# Patient Record
Sex: Male | Born: 1985 | Race: White | Hispanic: No | Marital: Married | Smoking: Former smoker
Health system: Southern US, Community
[De-identification: ages and names within clinical notes are randomized; demographics above are authoritative.]

## PROBLEM LIST (undated history)

## (undated) DIAGNOSIS — J301 Allergic rhinitis due to pollen: Secondary | ICD-10-CM

## (undated) HISTORY — DX: Allergic rhinitis due to pollen: J30.1

## (undated) HISTORY — PX: WISDOM TOOTH EXTRACTION: SHX21

---

## 2019-08-19 ENCOUNTER — Other Ambulatory Visit: Payer: Self-pay

## 2019-08-19 ENCOUNTER — Encounter: Payer: Self-pay | Admitting: Internal Medicine

## 2019-08-19 ENCOUNTER — Ambulatory Visit (INDEPENDENT_AMBULATORY_CARE_PROVIDER_SITE_OTHER): Payer: 59 | Admitting: Internal Medicine

## 2019-08-19 VITALS — BP 118/86 | HR 70 | Temp 97.7°F | Ht 70.25 in | Wt 222.0 lb

## 2019-08-19 DIAGNOSIS — Z0000 Encounter for general adult medical examination without abnormal findings: Secondary | ICD-10-CM | POA: Diagnosis not present

## 2019-08-19 DIAGNOSIS — J301 Allergic rhinitis due to pollen: Secondary | ICD-10-CM | POA: Insufficient documentation

## 2019-08-19 DIAGNOSIS — Z23 Encounter for immunization: Secondary | ICD-10-CM | POA: Diagnosis not present

## 2019-08-19 NOTE — Addendum Note (Signed)
Addended by: Pilar Grammes on: 08/19/2019 12:29 PM   Modules accepted: Orders

## 2019-08-19 NOTE — Assessment & Plan Note (Signed)
Healthy Needs to get back to his healthy lifestyle and get his weight back down Will defer blood work Tdap today Annual flu vaccine

## 2019-08-19 NOTE — Assessment & Plan Note (Signed)
Discussed stopping the afrin

## 2019-08-19 NOTE — Progress Notes (Signed)
Subjective:    Patient ID: Jamie Baldwin, male    DOB: [birth year], 33 y.o.   MRN: [id number]  HPI Here to establish care and physical  This visit occurred during the SARS-CoV-2 public health emergency.  Safety protocols were in place, including screening questions prior to the visit, additional usage of staff PPE, and extensive cleaning of exam room while observing appropriate contact time as indicated for disinfecting solutions.   No concerns  No current outpatient medications on file prior to visit.   No current facility-administered medications on file prior to visit.     No Known Allergies  History reviewed. No pertinent past medical history.  Past Surgical History:  Procedure Laterality Date  . WISDOM TOOTH EXTRACTION      Family History  Problem Relation Age of Onset  . Diabetes Neg Hx   . Cancer Neg Hx   . Heart disease Neg Hx     Social History   Socioeconomic History  . Marital status: Married    Spouse name: Not on file  . Number of children: 0  . Years of education: Not on file  . Highest education level: Not on file  Occupational History  . 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 Financial resource strain: Not on file  . Food insecurity    Worry: Not on file    Inability: Not on file  . Transportation needs    Medical: Not on file    Non-medical: Not on file  Tobacco Use  . Smoking status: Former Smoker    Quit date: 2019    Years since quitting: 1.9  . Smokeless tobacco: Former Systems developer    Quit date: 2008  Substance and Sexual Activity  . Alcohol use: Not on file    Comment: very occasional  . Drug use: Not on file  . Sexual activity: Not on file  Lifestyle  . Physical activity    Days per week: Not on file    Minutes per session: Not on file  . Stress: Not on file  Relationships  . Social Herbalist on phone: Not on file    Gets together: Not on file    Attends religious service: Not on file    Active member of club or  organization: Not on file    Attends meetings of clubs or organizations: Not on file    Relationship status: Not on file  . Intimate partner violence    Fear of current or ex partner: Not on file    Emotionally abused: Not on file    Physically abused: Not on file    Forced sexual activity: Not on file  Other Topics Concern  . Not on file  Social History Narrative  . Not on file   Review of Systems  Constitutional:       Weight up and down--depending on fitness efforts (up a lot since COVID) Wears seat belt  HENT: Negative for dental problem, hearing loss and tinnitus.        Keeps up with dentist  Eyes: Negative for visual disturbance.       No diplopia or unilateral vision loss  Respiratory: Negative for cough, chest tightness and shortness of breath.   Cardiovascular: Negative for chest pain and leg swelling.       Some palpitations if excessive caffeine  Gastrointestinal: Negative for abdominal pain, blood in stool and constipation.       No heartburn  Endocrine: Negative for polydipsia  and polyuria.  Genitourinary: Negative for difficulty urinating and urgency.       No sexual problems  Musculoskeletal: Negative for arthralgias, back pain and joint swelling.  Skin: Negative for rash.       No suspicious skin lesions  Allergic/Immunologic: Positive for environmental allergies. Negative for immunocompromised state.       Spring allergies have persisted----using afrin daily Discussed stopping this  Neurological: Negative for dizziness, syncope, light-headedness and headaches.  Hematological: Negative for adenopathy. Does not bruise/bleed easily.  Psychiatric/Behavioral: Positive for sleep disturbance. Negative for dysphoric mood. The patient is not nervous/anxious.        Awakens every 3-4 hours. No snoring or apnea       Objective:   Physical Exam  Constitutional: He is oriented to person, place, and time. He appears well-developed. No distress.  HENT:  Head:  Normocephalic and atraumatic.  Right Ear: External ear normal.  Left Ear: External ear normal.  Mouth/Throat: Oropharynx is clear and moist. No oropharyngeal exudate.  Eyes: Pupils are equal, round, and reactive to light. Conjunctivae are normal.  Neck: No thyromegaly present.  Cardiovascular: Normal rate, regular rhythm, normal heart sounds and intact distal pulses. Exam reveals no gallop.  No murmur heard. Respiratory: Effort normal and breath sounds normal. No respiratory distress. He has no wheezes. He has no rales.  GI: Soft. There is no abdominal tenderness.  Musculoskeletal:        General: No tenderness or edema.  Lymphadenopathy:    He has no cervical adenopathy.  Neurological: He is alert and oriented to person, place, and time.  Skin: No rash noted. No erythema.  Psychiatric: He has a normal mood and affect. His behavior is normal.           Assessment & Plan:

## 2019-09-11 DIAGNOSIS — M48061 Spinal stenosis, lumbar region without neurogenic claudication: Secondary | ICD-10-CM

## 2019-09-11 HISTORY — DX: Spinal stenosis, lumbar region without neurogenic claudication: M48.061

## 2020-05-06 ENCOUNTER — Telehealth (HOSPITAL_COMMUNITY): Payer: Self-pay | Admitting: Radiology

## 2020-05-06 NOTE — Telephone Encounter (Signed)
Pt was referred to [facility] for L3 compression fracture. Called the referring office, left VM that pt only has x-rays and per Dr. Corliss Skains will need an MRI to determine if he is a candidate for treatment. JM

## 2020-05-17 ENCOUNTER — Telehealth (HOSPITAL_COMMUNITY): Payer: Self-pay

## 2020-05-17 NOTE — Telephone Encounter (Signed)
Referring office sent over a cd with imaging but the imaging on the CD was of xray films. Called the office back to inform them that the pt will need an MRI before we could get them in. Left a vm. AW

## 2020-05-18 ENCOUNTER — Other Ambulatory Visit: Payer: Self-pay | Admitting: Internal Medicine

## 2020-05-18 DIAGNOSIS — M545 Low back pain, unspecified: Secondary | ICD-10-CM

## 2020-05-18 DIAGNOSIS — S32030S Wedge compression fracture of third lumbar vertebra, sequela: Secondary | ICD-10-CM

## 2020-05-25 ENCOUNTER — Ambulatory Visit (HOSPITAL_COMMUNITY): Admission: RE | Admit: 2020-05-25 | Payer: 59 | Source: Ambulatory Visit

## 2020-05-31 ENCOUNTER — Other Ambulatory Visit: Payer: Self-pay

## 2020-05-31 ENCOUNTER — Ambulatory Visit (HOSPITAL_COMMUNITY)
Admission: RE | Admit: 2020-05-31 | Discharge: 2020-05-31 | Disposition: A | Discharge status: Home / Self Care | Payer: 59 | Source: Ambulatory Visit | Attending: Internal Medicine | Admitting: Internal Medicine

## 2020-05-31 DIAGNOSIS — M545 Low back pain, unspecified: Secondary | ICD-10-CM

## 2020-05-31 DIAGNOSIS — S32030S Wedge compression fracture of third lumbar vertebra, sequela: Secondary | ICD-10-CM | POA: Diagnosis present

## 2020-05-31 MED ORDER — GADOBUTROL 1 MMOL/ML IV SOLN
10.0000 mL | Freq: Once | INTRAVENOUS | Status: AC | PRN
  Administered 2020-05-31: 10 mL via INTRAVENOUS

## 2020-06-01 ENCOUNTER — Telehealth (HOSPITAL_COMMUNITY): Payer: Self-pay

## 2020-06-01 NOTE — Telephone Encounter (Signed)
Called Dr. Elisha Headland office to inform them that Dr. Corliss Skains reviewed pt's most recent mri lumber for possible compression fracture. Per Dr. Corliss Skains, scan does not show any fractures and KP is not needed. Receptionist will let Dr. Kerry Dory know. AW

## 2020-06-16 ENCOUNTER — Ambulatory Visit: Payer: 59 | Admitting: Specialist

## 2020-06-16 ENCOUNTER — Ambulatory Visit: Payer: Self-pay

## 2020-06-16 ENCOUNTER — Encounter: Payer: Self-pay | Admitting: Specialist

## 2020-06-16 ENCOUNTER — Other Ambulatory Visit: Payer: Self-pay

## 2020-06-16 VITALS — BP 147/89 | HR 79 | Ht 72.5 in | Wt 188.8 lb

## 2020-06-16 DIAGNOSIS — M4726 Other spondylosis with radiculopathy, lumbar region: Secondary | ICD-10-CM

## 2020-06-16 DIAGNOSIS — M5416 Radiculopathy, lumbar region: Secondary | ICD-10-CM

## 2020-06-16 DIAGNOSIS — M5137 Other intervertebral disc degeneration, lumbosacral region: Secondary | ICD-10-CM

## 2020-06-16 DIAGNOSIS — M545 Low back pain, unspecified: Secondary | ICD-10-CM | POA: Diagnosis not present

## 2020-06-16 MED ORDER — TRAMADOL HCL 50 MG PO TABS: 100.0000 mg | ORAL_TABLET | Freq: Four times a day (QID) | ORAL | 0 refills | Status: DC | PRN

## 2020-06-16 MED ORDER — GABAPENTIN 300 MG PO CAPS: ORAL_CAPSULE | ORAL | 0 refills | Status: DC

## 2020-06-16 MED ORDER — METHYLPREDNISOLONE 4 MG PO TBPK: ORAL_TABLET | ORAL | 0 refills | Status: DC

## 2020-06-16 NOTE — Progress Notes (Signed)
Office Visit Note   Patient: Jamie Baldwin           Date of Birth: Dec 30, 1985           MRN: [id number] Visit Date: 06/16/2020              Requested by: Karie Schwalbe, MD [address] [place],  [place] [address] PCP: Angela Cox, MD   Assessment & Plan: Visit Diagnoses:  1. Low back pain, unspecified back pain laterality, unspecified chronicity, unspecified whether sciatica present   2. Lumbar radiculopathy   3. Disc disease, degenerative, lumbar or lumbosacral   4. Other spondylosis with radiculopathy, lumbar region     Plan: Avoid bending, stooping and avoid lifting weights greater than 10 lbs. Avoid prolong standing and walking. Avoid frequent bending and stooping  No lifting greater than 10 lbs. May use ice or moist heat for pain. Weight loss is of benefit. Handicap license is approved. Dr. [place] Blas secretary/Assistant will call to arrange for epidural steroid injection   Follow-Up Instructions: No follow-ups on file.   Orders:  Orders Placed This Encounter  Procedures  . XR Lumbar Spine 2-3 Views   No orders of the defined types were placed in this encounter.     Procedures: No procedures performed   Clinical Data: No additional findings.   Subjective: Chief Complaint  Patient presents with  . Lower Back - Pain    34 year old male fell on slippery back steps of house taking the dogs out and fell backwards landing on the back. Had pain in the back for about 2 weeks and saw a DC twice, short leg and pelvis bone was ?Marland Kitchen Felt better after 2 visits and stopped. Returned to normal hiked, went to the gym, played softball and lost 50 lbs this past summer 221 down to 173 then at the end of August he was sitting in chair at work when He went to stand and pain in the back and goes into the right mid thigh area not below the knee, posteriorly. No numbness, burning quality of  Pain that is sharp, feels like steakknives in the back. There is pain with  cough and sneezing. No bowel or bladder difficulty other than valsalva. He has no tingling or numbness. At times there is a sensation of weakness and he tires easly with walking . 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 There is pain with bending,stooping and riding in car is painful. Pain with twisting and turning.   Review of Systems  Constitutional: Positive for activity change and unexpected weight change. Negative for appetite change, chills, diaphoresis, fatigue and fever.  HENT: Negative.  Negative for congestion, dental problem, drooling, ear discharge, ear pain, facial swelling, hearing loss, mouth sores, nosebleeds, postnasal drip, rhinorrhea, sinus pressure, sinus pain, sneezing, sore throat, tinnitus, trouble swallowing and voice change.   Eyes: Negative for photophobia, pain, discharge, redness, itching and visual disturbance.  Respiratory: Negative.  Negative for apnea, cough, choking, chest tightness, shortness of breath, wheezing and stridor.   Cardiovascular: Negative.  Negative for chest pain, palpitations and leg swelling.  Gastrointestinal: Negative.  Negative for abdominal distention, abdominal pain, anal bleeding, blood in stool, constipation, diarrhea, nausea, rectal pain and vomiting.  Endocrine: Negative.  Negative for cold intolerance, heat intolerance, polydipsia and polyuria.  Genitourinary: Negative.  Negative for difficulty urinating, dysuria, enuresis, flank pain, frequency, genital sores and urgency.  Musculoskeletal: Positive for back pain and gait problem. Negative for arthralgias,  joint swelling, myalgias, neck pain and neck stiffness.  Skin: Negative.  Negative for color change, pallor, rash and wound.  Allergic/Immunologic: Negative for environmental allergies, food allergies and immunocompromised state.  Neurological: Positive for weakness. Negative for dizziness, tremors, seizures, syncope, facial asymmetry, speech difficulty, light-headedness, numbness  and headaches.  Hematological: Negative.  Negative for adenopathy. Does not bruise/bleed easily.  Psychiatric/Behavioral: Negative for agitation, behavioral problems, confusion, decreased concentration, dysphoric mood, hallucinations, self-injury, sleep disturbance and suicidal ideas. The patient is not nervous/anxious and is not hyperactive.      Objective: Vital Signs: BP (!) 147/89 (BP Location: Left Arm, Patient Position: Sitting)   Pulse 79   Ht 6' 0.5" (1.842 m)   Wt 188 lb 12.8 oz (85.6 kg)   BMI 25.25 kg/m   Physical Exam Constitutional:      Appearance: He is well-developed.  HENT:     Head: Normocephalic and atraumatic.  Eyes:     Pupils: Pupils are equal, round, and reactive to light.  Pulmonary:     Effort: Pulmonary effort is normal.     Breath sounds: Normal breath sounds.  Abdominal:     General: Bowel sounds are normal.     Palpations: Abdomen is soft.  Musculoskeletal:     Cervical back: Normal range of motion and neck supple.     Lumbar back: Positive right straight leg raise test. Negative left straight leg raise test.  Skin:    General: Skin is warm and dry.  Neurological:     Mental Status: He is alert and oriented to person, place, and time.  Psychiatric:        Behavior: Behavior normal.        Thought Content: Thought content normal.        Judgment: Judgment normal.     Back Exam   Tenderness  The patient is experiencing tenderness in the lumbar.  Range of Motion  Extension: abnormal  Flexion: abnormal  Lateral bend right: abnormal  Lateral bend left: abnormal  Rotation right: abnormal  Rotation left: abnormal   Muscle Strength  Right Quadriceps:  5/5  Left Quadriceps:  5/5  Right Hamstrings:  5/5  Left Hamstrings:  5/5   Tests  Straight leg raise right: positive Straight leg raise left: negative  Reflexes  Patellar: 3/4 Achilles: 2/4 Babinski's sign: normal   Comments:  Opposite SLR sign with left buttock pain with raising  right leg.       Specialty Comments:  No specialty comments available.  Imaging: No results found.   PMFS History: Patient Active Problem List   Diagnosis Date Noted  . Preventative health care 08/19/2019  . Allergic rhinitis due to pollen    Past Medical History:  Diagnosis Date  . Allergic rhinitis due to pollen     Family History  Problem Relation Age of Onset  . Diabetes Neg Hx   . Cancer Neg Hx   . Heart disease Neg Hx     Past Surgical History:  Procedure Laterality Date  . WISDOM TOOTH EXTRACTION     Social History   Occupational History  . 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 Smoking status: Former Smoker    Quit date: 2019    Years since quitting: 2.7  . Smokeless tobacco: Former Neurosurgeon    Quit date: 2008  Substance and Sexual Activity  . Alcohol use: Not on file    Comment: very occasional  . Drug use: Not on file  . Sexual activity:  Not on file

## 2020-06-16 NOTE — Patient Instructions (Signed)
Avoid bending, stooping and avoid lifting weights greater than 10 lbs. Avoid prolong standing and walking. Avoid frequent bending and stooping  No lifting greater than 10 lbs. May use ice or moist heat for pain. Weight loss is of benefit. Handicap license is approved. Dr. [place] Blas secretary/Assistant will call to arrange for epidural steroid injection  Gabapentin ramp one tablet per day up to TID for nerve pain Medrol dose pak steroids to decrease nerve swelling. Recliner or bed wedge.  Tramadol for pain.

## 2020-06-30 ENCOUNTER — Ambulatory Visit: Payer: 59 | Admitting: Surgery

## 2020-07-06 ENCOUNTER — Encounter: Payer: Self-pay | Admitting: Physical Medicine and Rehabilitation

## 2020-07-06 ENCOUNTER — Ambulatory Visit: Payer: 59 | Admitting: Physical Medicine and Rehabilitation

## 2020-07-06 ENCOUNTER — Other Ambulatory Visit: Payer: Self-pay

## 2020-07-06 ENCOUNTER — Ambulatory Visit: Payer: Self-pay

## 2020-07-06 VITALS — BP 129/90 | HR 70

## 2020-07-06 DIAGNOSIS — M5416 Radiculopathy, lumbar region: Secondary | ICD-10-CM

## 2020-07-06 MED ORDER — METHYLPREDNISOLONE ACETATE 80 MG/ML IJ SUSP
80.0000 mg | Freq: Once | INTRAMUSCULAR | Status: AC
  Administered 2020-07-06: 15:00:00 80 mg

## 2020-07-06 NOTE — Progress Notes (Signed)
Pt state lower back pain that travels up to his mid back and also down to his buttocks. Pt state bending over, sitting and walking for a long period of time makes the pain worse. Pt state he takes pain meds at night and heating and icing to helps ease the pain.  Numeric Pain Rating Scale and Functional Assessment Average Pain 8   In the last MONTH (on 0-10 scale) has pain interfered with the following?  1. General activity like being  able to carry out your everyday physical activities such as walking, climbing stairs, carrying groceries, or moving a chair?  Rating(10)   +Driver, -BT, -Dye Allergies.

## 2020-08-23 NOTE — Progress Notes (Signed)
Jamie Baldwin - 34 y.o. male MRN [id number]  Date of birth: 1986/03/24  Office Visit Note: Visit Date: 07/06/2020 PCP: Angela Cox, MD Referred by: Angela Cox, MD  Subjective: Chief Complaint  Patient presents with  . Lower Back - Pain   HPI:  Jamie Baldwin is a 34 y.o. male who comes in today at the request of Dr. Vira Browns for planned Left L5-S1 Lumbar epidural steroid injection with fluoroscopic guidance.  The patient has failed conservative care including home exercise, medications, time and activity modification.  This injection will be diagnostic and hopefully therapeutic.  Please see requesting physician notes for further details and justification.   ROS Otherwise per HPI.  Assessment & Plan: Visit Diagnoses:    ICD-10-CM   1. Lumbar radiculopathy  M54.16 XR C-ARM NO REPORT    Epidural Steroid injection    methylPREDNISolone acetate (DEPO-MEDROL) injection 80 mg    Plan: No additional findings.   Meds & Orders:  Meds ordered this encounter  Medications  . methylPREDNISolone acetate (DEPO-MEDROL) injection 80 mg    Orders Placed This Encounter  Procedures  . XR C-ARM NO REPORT  . Epidural Steroid injection    Follow-up: Return for visit to requesting physician as needed.   Procedures: No procedures performed  Lumbosacral Transforaminal Epidural Steroid Injection - Sub-Pedicular Approach with Fluoroscopic Guidance  Patient: Jamie Baldwin      Date of Birth: [birth year] MRN: [id number] PCP: Angela Cox, MD      Visit Date: 07/06/2020   Universal Protocol:    Date/Time: 07/06/2020  Consent Given By: the patient  Position: PRONE  Additional Comments: Vital signs were monitored before and after the procedure. Patient was prepped and draped in the usual sterile fashion. The correct patient, procedure, and site was verified.   Injection Procedure Details:   Procedure diagnoses: Lumbar radiculopathy [M54.16]    Meds Administered:  Meds  ordered this encounter  Medications  . methylPREDNISolone acetate (DEPO-MEDROL) injection 80 mg    Laterality: Left  Location/Site:  L5-S1  Needle:5.0 in., 22 ga.  Short bevel or Quincke spinal needle  Needle Placement: Transforaminal  Findings:    -Comments: Excellent flow of contrast along the nerve, nerve root and into the epidural space.  Procedure Details: After squaring off the end-plates to get a true AP view, the C-arm was positioned so that an oblique view of the foramen as noted above was visualized. The target area is just inferior to the "nose of the scotty dog" or sub pedicular. The soft tissues overlying this structure were infiltrated with 2-3 ml. of 1% Lidocaine without Epinephrine.  The spinal needle was inserted toward the target using a "trajectory" view along the fluoroscope beam.  Under AP and lateral visualization, the needle was advanced so it did not puncture dura and was located close the 6 O'Clock position of the pedical in AP tracterory. Biplanar projections were used to confirm position. Aspiration was confirmed to be negative for CSF and/or blood. A 1-2 ml. volume of Isovue-250 was injected and flow of contrast was noted at each level. Radiographs were obtained for documentation purposes.   After attaining the desired flow of contrast documented above, a 0.5 to 1.0 ml test dose of 0.25% Marcaine was injected into each respective transforaminal space.  The patient was observed for 90 seconds post injection.  After no sensory deficits were reported, and normal lower extremity motor function was noted,   the above injectate was administered so that equal  amounts of the injectate were placed at each foramen (level) into the transforaminal epidural space.   Additional Comments:  The patient tolerated the procedure well Dressing: 2 x 2 sterile gauze and Band-Aid    Post-procedure details: Patient was observed during the procedure. Post-procedure instructions  were reviewed.  Patient left the clinic in stable condition.      Clinical History: MRI LUMBAR SPINE WITHOUT AND WITH CONTRAST  TECHNIQUE: Multiplanar and multiecho pulse sequences of the lumbar spine were obtained without and with intravenous contrast.  CONTRAST:  59mL GADAVIST GADOBUTROL 1 MMOL/ML IV SOLN  COMPARISON:  None.  FINDINGS: Segmentation:  Standard.  Alignment:  Straightening of lordosis.  Vertebrae: Modic type 2 endplate degenerative changes at the L4-S1 level. No focal osseous lesion. No abnormal enhancement.  Conus medullaris and cauda equina: Conus extends to the L1 level. Conus and cauda equina appear normal. No abnormal enhancement.  Disc levels: Multilevel desiccation. Mild L2-3 and moderate L5-S1 disc space loss.  L1-2: No significant disc bulge, spinal canal or neural foraminal narrowing.  L2-3: Mild disc bulge with superimposed right subarticular/foraminal protrusion abutting the descending right L3 nerve root. Bilateral facet degenerative spurring. Mild spinal canal narrowing. Patent neural foramen.  L3-4: Mild disc bulge and bilateral facet degenerative spurring. Patent spinal canal and neural foramen.  L4-5: Mild disc bulge with small central protrusion. Right extraforaminal annular fissuring. Bilateral facet degenerative spurring. Mild bilateral neural foraminal narrowing. Patent spinal canal.  L5-S1: Mild disc bulge with superimposed central protrusion/annular fissuring. There is abutment of the exiting left L5 and descending left S1 nerve roots. Bilateral facet degenerative spurring. Mild left neural foraminal narrowing. Patent spinal canal and right neural foramen.  Paraspinal and other soft tissues: Negative.  IMPRESSION: Right L2-3 subarticular/foraminal protrusion abutting the descending right L3 nerve root.  Central L5-S1 protrusion/annular fissuring abutting the exiting left L5 and descending left S1 nerve  roots.  Mild L2-3 spinal canal narrowing.  Mild bilateral L4-5 and left L5-S1 neural foraminal narrowing.   Electronically Signed   By: Stana Bunting M.D.   On: 05/31/2020 09:33     Objective:  VS:  HT:    WT:   BMI:     BP:129/90  HR:70bpm  TEMP: ( )  RESP:  Physical Exam Constitutional:      General: He is not in acute distress.    Appearance: Normal appearance. He is not ill-appearing.  HENT:     Head: Normocephalic and atraumatic.     Right Ear: External ear normal.     Left Ear: External ear normal.  Eyes:     Extraocular Movements: Extraocular movements intact.  Cardiovascular:     Rate and Rhythm: Normal rate.     Pulses: Normal pulses.  Abdominal:     General: There is no distension.     Palpations: Abdomen is soft.  Musculoskeletal:        General: No tenderness or signs of injury.     Right lower leg: No edema.     Left lower leg: No edema.     Comments: Patient has good distal strength without clonus.  Positive left slump.  Skin:    Findings: No erythema or rash.  Neurological:     General: No focal deficit present.     Mental Status: He is alert and oriented to person, place, and time.     Sensory: No sensory deficit.     Motor: No weakness or abnormal muscle tone.     Coordination:  Coordination normal.  Psychiatric:        Mood and Affect: Mood normal.        Behavior: Behavior normal.      Imaging: No results found.

## 2020-08-23 NOTE — Procedures (Signed)
Lumbosacral Transforaminal Epidural Steroid Injection - Sub-Pedicular Approach with Fluoroscopic Guidance  Patient: Jamie Baldwin      Date of Birth: [birth year] MRN: [id number] PCP: Angela Cox, MD      Visit Date: 07/06/2020   Universal Protocol:    Date/Time: 07/06/2020  Consent Given By: the patient  Position: PRONE  Additional Comments: Vital signs were monitored before and after the procedure. Patient was prepped and draped in the usual sterile fashion. The correct patient, procedure, and site was verified.   Injection Procedure Details:   Procedure diagnoses: Lumbar radiculopathy [M54.16]    Meds Administered:  Meds ordered this encounter  Medications   methylPREDNISolone acetate (DEPO-MEDROL) injection 80 mg    Laterality: Left  Location/Site:  L5-S1  Needle:5.0 in., 22 ga.  Short bevel or Quincke spinal needle  Needle Placement: Transforaminal  Findings:    -Comments: Excellent flow of contrast along the nerve, nerve root and into the epidural space.  Procedure Details: After squaring off the end-plates to get a true AP view, the C-arm was positioned so that an oblique view of the foramen as noted above was visualized. The target area is just inferior to the "nose of the scotty dog" or sub pedicular. The soft tissues overlying this structure were infiltrated with 2-3 ml. of 1% Lidocaine without Epinephrine.  The spinal needle was inserted toward the target using a "trajectory" view along the fluoroscope beam.  Under AP and lateral visualization, the needle was advanced so it did not puncture dura and was located close the 6 O'Clock position of the pedical in AP tracterory. Biplanar projections were used to confirm position. Aspiration was confirmed to be negative for CSF and/or blood. A 1-2 ml. volume of Isovue-250 was injected and flow of contrast was noted at each level. Radiographs were obtained for documentation purposes.   After attaining the desired  flow of contrast documented above, a 0.5 to 1.0 ml test dose of 0.25% Marcaine was injected into each respective transforaminal space.  The patient was observed for 90 seconds post injection.  After no sensory deficits were reported, and normal lower extremity motor function was noted,   the above injectate was administered so that equal amounts of the injectate were placed at each foramen (level) into the transforaminal epidural space.   Additional Comments:  The patient tolerated the procedure well Dressing: 2 x 2 sterile gauze and Band-Aid    Post-procedure details: Patient was observed during the procedure. Post-procedure instructions were reviewed.  Patient left the clinic in stable condition.

## 2020-09-15 ENCOUNTER — Encounter: Payer: Self-pay | Admitting: Specialist

## 2020-09-15 ENCOUNTER — Other Ambulatory Visit: Payer: Self-pay

## 2020-09-15 ENCOUNTER — Ambulatory Visit: Payer: 59 | Admitting: Specialist

## 2020-09-15 VITALS — BP 139/85 | HR 78 | Ht 72.25 in | Wt 188.8 lb

## 2020-09-15 DIAGNOSIS — M4726 Other spondylosis with radiculopathy, lumbar region: Secondary | ICD-10-CM

## 2020-09-15 DIAGNOSIS — M5137 Other intervertebral disc degeneration, lumbosacral region: Secondary | ICD-10-CM

## 2020-09-15 DIAGNOSIS — M5416 Radiculopathy, lumbar region: Secondary | ICD-10-CM

## 2020-09-15 NOTE — Progress Notes (Addendum)
Office Visit Note   Patient: Jamie Baldwin           Date of Birth: 06-08-1986           MRN: [id number] Visit Date: 09/15/2020              Requested by: Angela Cox, MD [address] [place],  [place] [address] PCP: Angela Cox, MD   Assessment & Plan: Visit Diagnoses:  1. Lumbar radiculopathy   2. Disc disease, degenerative, lumbar or lumbosacral   3. Other spondylosis with radiculopathy, lumbar region     Plan: Avoid bending, stooping and avoid lifting weights greater than 10 lbs. Avoid prolong standing and walking. Order for a new walker with wheels. Surgery scheduling secretary Tivis Ringer, will call you in the next week to schedule for surgery.  Surgery recommended is a two level lumbar fusion L5-S1 and L4-5  this would be done with rods, screws and cages with local bone graft and allograft (donor bone graft). Take hydrocodone for for pain. Risk of surgery includes risk of infection 1 in 200 patients, bleeding 1/2% chance you would need a transfusion.   Risk to the nerves is one in 10,000. You will need to use a brace for 3 months and wean from the brace on the 4th month. Expect improved walking and standing tolerance. Expect relief of leg pain but numbness may persist depending on the length and degree of pressure that has been present  Follow-Up Instructions: No follow-ups on file.   Orders:  No orders of the defined types were placed in this encounter.  No orders of the defined types were placed in this encounter.     Procedures: No procedures performed   Clinical Data: No additional findings.   Subjective: Chief Complaint  Patient presents with  . Lower Back - Follow-up    He had Left L5-S1 TF Injection with Dr. Alvester Morin on 07/06/20 and he states that he did not get any relief with the injection    35 year old male with persistent back and bilateral thigh and buttock pain. His job is primarily sitting and desk work. He is  having pain with sitting and riding car and with any bend or twist. An attempt at left transforaminal ESI was able to replicate the left leg pain with the instillation of the steroid and local but did not provide any long term or even short term relief. He is unable to walk without pain. He is not taking narcotics and performing work on a continuous basis. Has night pain and difficulty sleeping due to back pain and pain with roling in  Bed. Underwent MRI 06/2020 showing degenerative disc dessication L2-3, L3-4, L4-5 and L5-S1. There is a small non compressive disc  Bulge right L2-3, noncompressive bulge L3-4 without stenosis. At L4-5 and L5-S1 there are modic changes of the anterior disc end plates and left sided L5-S1 foraminal disc herniation which likely impresses on the left L5 nerve root. He has been treated conservatively for well over a year and is unable to function normally with persistent mechanical disc pain and discomfort associated with any flexion or twisting  Moment.    Review of Systems  Constitutional: Positive for activity change and unexpected weight change (weight gain due to inability to ambulate.). Negative for appetite change, chills, diaphoresis, fatigue and fever.  HENT: Negative.  Negative for congestion, dental problem, drooling, ear discharge, ear pain, facial swelling, hearing loss, mouth sores, nosebleeds, postnasal  drip, rhinorrhea, sinus pressure, sinus pain, sneezing, sore throat, tinnitus, trouble swallowing and voice change.   Eyes: Negative.   Respiratory: Negative.   Cardiovascular: Negative.   Gastrointestinal: Negative.   Genitourinary: Negative.   Musculoskeletal: Negative.   Skin: Negative.   Hematological: Negative.   Psychiatric/Behavioral: Negative.      Objective: Vital Signs: BP 139/85   Pulse 78   Ht 6' 0.25" (1.835 m)   Wt 188 lb 12.8 oz (85.6 kg)   BMI 25.43 kg/m   Physical Exam  Ortho Exam  Specialty Comments:  No specialty comments  available.  Imaging: No results found.   PMFS History: Patient Active Problem List   Diagnosis Date Noted  . Preventative health care 08/19/2019  . Allergic rhinitis due to pollen    Past Medical History:  Diagnosis Date  . Allergic rhinitis due to pollen     Family History  Problem Relation Age of Onset  . Diabetes Neg Hx   . Cancer Neg Hx   . Heart disease Neg Hx     Past Surgical History:  Procedure Laterality Date  . WISDOM TOOTH EXTRACTION     Social History   Occupational History  . 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 Smoking status: Former Smoker    Quit date: 2019    Years since quitting: 3.0  . Smokeless tobacco: Former Neurosurgeon    Quit date: 2008  Substance and Sexual Activity  . Alcohol use: Not on file    Comment: very occasional  . Drug use: Not on file  . Sexual activity: Not on file

## 2020-09-15 NOTE — Patient Instructions (Signed)
Avoid bending, stooping and avoid lifting weights greater than 10 lbs. Avoid prolong standing and walking. Order for a new walker with wheels. Surgery scheduling secretary Tivis Ringer, will call you in the next week to schedule for surgery.  Surgery recommended is a two level lumbar fusion L5-S1 and L4-5  this would be done with rods, screws and cages with local bone graft and allograft (donor bone graft). Take hydrocodone for for pain. Risk of surgery includes risk of infection 1 in 200 patients, bleeding 1/2% chance you would need a transfusion.   Risk to the nerves is one in 10,000. You will need to use a brace for 3 months and wean from the brace on the 4th month. Expect improved walking and standing tolerance. Expect relief of leg pain but numbness may persist depending on the length and degree of pressure that has been present.

## 2020-10-13 ENCOUNTER — Ambulatory Visit: Payer: 59 | Admitting: Specialist

## 2020-10-13 ENCOUNTER — Other Ambulatory Visit: Payer: Self-pay

## 2020-10-26 ENCOUNTER — Encounter: Payer: Self-pay | Admitting: Surgery

## 2020-10-26 ENCOUNTER — Ambulatory Visit (INDEPENDENT_AMBULATORY_CARE_PROVIDER_SITE_OTHER): Payer: 59 | Admitting: Surgery

## 2020-10-26 VITALS — BP 125/78 | HR 78 | Ht 71.0 in | Wt 216.0 lb

## 2020-10-26 DIAGNOSIS — M5416 Radiculopathy, lumbar region: Secondary | ICD-10-CM

## 2020-10-26 DIAGNOSIS — M4726 Other spondylosis with radiculopathy, lumbar region: Secondary | ICD-10-CM

## 2020-10-26 NOTE — Progress Notes (Signed)
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  States that symptoms unchanged previous visit.  He is wanting to proceed with LEFT L4-5 AND L5-S1 LEFT TRANSFORAMINAL LUMBAR INTERBODY FUSION WITH PEDICLE SCREWS, RODS AND CAGES, LOCAL AND ALLOGRAFT BONE GRAFT, VIVIGEN.  Today history physical performed.  Review of systems negative.  Surgical procedure discussed along with potential recovery time.  All questions answered.

## 2020-10-28 ENCOUNTER — Encounter (HOSPITAL_COMMUNITY)
Admission: RE | Admit: 2020-10-28 | Discharge: 2020-10-28 | Disposition: A | Discharge status: Home / Self Care | Payer: 59 | Source: Ambulatory Visit | Attending: Specialist | Admitting: Specialist

## 2020-10-28 ENCOUNTER — Encounter (HOSPITAL_COMMUNITY): Payer: Self-pay

## 2020-10-28 ENCOUNTER — Other Ambulatory Visit: Payer: Self-pay

## 2020-10-28 ENCOUNTER — Other Ambulatory Visit (HOSPITAL_COMMUNITY)
Admission: RE | Admit: 2020-10-28 | Discharge: 2020-10-28 | Disposition: A | Discharge status: Home / Self Care | Payer: 59 | Source: Ambulatory Visit | Attending: Specialist | Admitting: Specialist

## 2020-10-28 DIAGNOSIS — Z01812 Encounter for preprocedural laboratory examination: Secondary | ICD-10-CM | POA: Insufficient documentation

## 2020-10-28 DIAGNOSIS — Z20822 Contact with and (suspected) exposure to covid-19: Secondary | ICD-10-CM | POA: Insufficient documentation

## 2020-10-28 LAB — SURGICAL PCR SCREEN
MRSA, PCR: NEGATIVE
Staphylococcus aureus: NEGATIVE

## 2020-10-28 LAB — SARS CORONAVIRUS 2 (TAT 6-24 HRS): SARS Coronavirus 2: NEGATIVE

## 2020-10-28 LAB — CBC
HCT: 45.1 % (ref 39.0–52.0)
Hemoglobin: 15.2 g/dL (ref 13.0–17.0)
MCH: 29.7 pg (ref 26.0–34.0)
MCHC: 33.7 g/dL (ref 30.0–36.0)
MCV: 88.3 fL (ref 80.0–100.0)
Platelets: 266 10*3/uL (ref 150–400)
RBC: 5.11 MIL/uL (ref 4.22–5.81)
RDW: 11.8 % (ref 11.5–15.5)
WBC: 7.9 10*3/uL (ref 4.0–10.5)
nRBC: 0 % (ref 0.0–0.2)

## 2020-10-28 LAB — COMPREHENSIVE METABOLIC PANEL
ALT: 12 U/L (ref 0–44)
AST: 25 U/L (ref 15–41)
Albumin: 4.5 g/dL (ref 3.5–5.0)
Alkaline Phosphatase: 87 U/L (ref 38–126)
Anion gap: 8 (ref 5–15)
BUN: 9 mg/dL (ref 6–20)
CO2: 28 mmol/L (ref 22–32)
Calcium: 9.8 mg/dL (ref 8.9–10.3)
Chloride: 103 mmol/L (ref 98–111)
Creatinine, Ser: 0.85 mg/dL (ref 0.61–1.24)
GFR, Estimated: >60 mL/min (ref >60)
Glucose, Bld: 104 mg/dL — ABNORMAL HIGH (ref 70–99)
Potassium: 4.1 mmol/L (ref 3.5–5.1)
Sodium: 139 mmol/L (ref 135–145)
Total Bilirubin: 0.8 mg/dL (ref 0.3–1.2)
Total Protein: 7.6 g/dL (ref 6.5–8.1)

## 2020-10-28 LAB — PROTIME-INR
INR: 1 (ref 0.8–1.2)
Prothrombin Time: 12.3 seconds (ref 11.4–15.2)

## 2020-10-28 LAB — TYPE AND SCREEN
ABO/RH(D): B POS
Antibody Screen: NEGATIVE

## 2020-10-28 NOTE — Progress Notes (Signed)
PCP - Dr. Karlene Einstein with [facility] - Denies  Chest x-ray - Not indicated EKG - Not indicated Stress Test - denies ECHO - denies Cardiac Cath - denies  Sleep Study - No OSA  DM - denies  ERAS Protcol -Yes PRE-SURGERY Ensure given   COVID TEST- 10/28/20   Anesthesia review: No  Patient denies shortness of breath, fever, cough and chest pain at PAT appointment   All instructions explained to the patient, with a verbal understanding of the material. Patient agrees to go over the instructions while at home for a better understanding. Patient also instructed to self quarantine after being tested for COVID-19. The opportunity to ask questions was provided.

## 2020-10-28 NOTE — Progress Notes (Signed)
Surgical Instructions    Your procedure is scheduled on Tuesday February 22nd.  Report to [facility] Main Entrance "A" at 5:30 A.M., then check in with the Admitting office.  Call this number if you have problems the morning of surgery:  [phone]   If you have any questions prior to your surgery date call [phone]: Open Monday-Friday 8am-4pm    Remember:  Do not eat after midnight the night before your surgery  You may drink clear liquids until 4:30 the morning of your surgery.   Clear liquids allowed are: Water, Non-Citrus Juices (without pulp), Carbonated Beverages, Clear Tea, Black Coffee Only, and Gatorade  Please complete your PRE-SURGERY ENSURE that was provided to you by 4:30am... the morning of surgery.  Please, if able, drink it in one setting. DO NOT SIP.     Take these medicines the morning of surgery with A SIP OF WATER   methylPREDNISolone (MEDROL DOSEPAK) 4 MG TBPK tablet  IF NEEDED  traMADol (ULTRAM) 50 MG tablet  As of today, STOP taking any Aspirin (unless otherwise instructed by your surgeon) Aleve, Naproxen, Ibuprofen, Motrin, Advil, Goody's, BC's, all herbal medications, fish oil, and all vitamins.                     Do not wear jewelry            Do not wear lotions, powders, colognes, or deodorant.            Do not shave 48 hours prior to surgery.  Men may shave face and neck.            Do not bring valuables to the hospital.            [facility] is not responsible for any belongings or valuables.  Do NOT Smoke (Tobacco/Vaping) or drink Alcohol 24 hours prior to your procedure If you use a CPAP at night, you may bring all equipment for your overnight stay.   Contacts, glasses, dentures or bridgework may not be worn into surgery, please bring cases for these belongings   For patients admitted to the hospital, discharge time will be determined by your treatment team.   Patients discharged the day of surgery will not be allowed to drive home,  and someone needs to stay with them for 24 hours.    Special instructions:   [facility]- Preparing For Surgery  Before surgery, you can play an important role. Because skin is not sterile, your skin needs to be as free of germs as possible. You can reduce the number of germs on your skin by washing with CHG (chlorahexidine gluconate) Soap before surgery.  CHG is an antiseptic cleaner which kills germs and bonds with the skin to continue killing germs even after washing.    Oral Hygiene is also important to reduce your risk of infection.  Remember - BRUSH YOUR TEETH THE MORNING OF SURGERY WITH YOUR REGULAR TOOTHPASTE  Please do not use if you have an allergy to CHG or antibacterial soaps. If your skin becomes reddened/irritated stop using the CHG.  Do not shave (including legs and underarms) for at least 48 hours prior to first CHG shower. It is OK to shave your face.  Please follow these instructions carefully.   1. Shower the NIGHT BEFORE SURGERY and the MORNING OF SURGERY  2. If you chose to wash your hair, wash your hair first as usual with your normal shampoo.  3. After you shampoo, rinse  your hair and body thoroughly to remove the shampoo.  4. Wash Face and genitals (private parts) with your normal soap.   5.  Shower the NIGHT BEFORE SURGERY and the MORNING OF SURGERY with CHG Soap.   6. Use CHG Soap as you would any other liquid soap. You can apply CHG directly to the skin and wash gently with a scrungie or a clean washcloth.   7. Apply the CHG Soap to your body ONLY FROM THE NECK DOWN.  Do not use on open wounds or open sores. Avoid contact with your eyes, ears, mouth and genitals (private parts). Wash Face and genitals (private parts)  with your normal soap.   8. Wash thoroughly, paying special attention to the area where your surgery will be performed.  9. Thoroughly rinse your body with warm water from the neck down.  10. DO NOT shower/wash with your normal soap after  using and rinsing off the CHG Soap.  11. Pat yourself dry with a CLEAN TOWEL.  12. Wear CLEAN PAJAMAS to bed the night before surgery  13. Place CLEAN SHEETS on your bed the night before your surgery  14. DO NOT SLEEP WITH PETS.   Day of Surgery: Wear Clean/Comfortable clothing the morning of surgery Do not apply any deodorants/lotions.   Remember to brush your teeth WITH YOUR REGULAR TOOTHPASTE.   Please read over the following fact sheets that you were given.

## 2020-10-31 NOTE — Anesthesia Preprocedure Evaluation (Addendum)
Anesthesia Evaluation  Patient identified by MRN, date of birth, ID band Patient awake    Reviewed: Allergy & Precautions, H&P , NPO status , Patient's Chart, lab work & pertinent test results  Airway Mallampati: I  TM Distance: >3 FB Neck ROM: Full    Dental no notable dental hx.    Pulmonary neg pulmonary ROS, former smoker,    Pulmonary exam normal breath sounds clear to auscultation       Cardiovascular negative cardio ROS Normal cardiovascular exam Rhythm:Regular Rate:Normal     Neuro/Psych negative neurological ROS  negative psych ROS   GI/Hepatic negative GI ROS, Neg liver ROS,   Endo/Other  negative endocrine ROS  Renal/GU negative Renal ROS  negative genitourinary   Musculoskeletal Lumbar stenosis   Abdominal   Peds negative pediatric ROS (+)  Hematology negative hematology ROS (+)   Anesthesia Other Findings   Reproductive/Obstetrics negative OB ROS                            Anesthesia Physical Anesthesia Plan  ASA: II  Anesthesia Plan: General   Post-op Pain Management:    Induction: Intravenous  PONV Risk Score and Plan: Ondansetron, Dexamethasone, Propofol infusion, TIVA and Treatment may vary due to age or medical condition  Airway Management Planned: Oral ETT  Additional Equipment:   Intra-op Plan:   Post-operative Plan: Extubation in OR  Informed Consent: I have reviewed the patients History and Physical, chart, labs and discussed the procedure including the risks, benefits and alternatives for the proposed anesthesia with the patient or authorized representative who has indicated his/her understanding and acceptance.     Dental advisory given  Plan Discussed with: CRNA, Anesthesiologist and Surgeon  Anesthesia Plan Comments:        Anesthesia Quick Evaluation

## 2020-11-01 ENCOUNTER — Inpatient Hospital Stay (HOSPITAL_COMMUNITY): Payer: 59 | Admitting: Anesthesiology

## 2020-11-01 ENCOUNTER — Inpatient Hospital Stay (HOSPITAL_COMMUNITY)
Admission: RE | Disposition: A | Discharge status: Home / Self Care | Payer: Self-pay | Source: Home / Self Care | Attending: Specialist

## 2020-11-01 ENCOUNTER — Other Ambulatory Visit: Payer: Self-pay

## 2020-11-01 ENCOUNTER — Encounter (HOSPITAL_COMMUNITY): Payer: Self-pay | Admitting: Specialist

## 2020-11-01 ENCOUNTER — Inpatient Hospital Stay (HOSPITAL_COMMUNITY)
Admission: RE | Admit: 2020-11-01 | Discharge: 2020-11-03 | DRG: 455 | Disposition: A | Discharge status: Home / Self Care | Payer: 59 | Attending: Specialist | Admitting: Specialist

## 2020-11-01 ENCOUNTER — Inpatient Hospital Stay (HOSPITAL_COMMUNITY): Payer: 59

## 2020-11-01 DIAGNOSIS — Z79899 Other long term (current) drug therapy: Secondary | ICD-10-CM

## 2020-11-01 DIAGNOSIS — M4807 Spinal stenosis, lumbosacral region: Secondary | ICD-10-CM | POA: Diagnosis present

## 2020-11-01 DIAGNOSIS — Z87891 Personal history of nicotine dependence: Secondary | ICD-10-CM | POA: Diagnosis not present

## 2020-11-01 DIAGNOSIS — Z419 Encounter for procedure for purposes other than remedying health state, unspecified: Secondary | ICD-10-CM

## 2020-11-01 DIAGNOSIS — M5117 Intervertebral disc disorders with radiculopathy, lumbosacral region: Secondary | ICD-10-CM | POA: Diagnosis present

## 2020-11-01 DIAGNOSIS — M5136 Other intervertebral disc degeneration, lumbar region: Secondary | ICD-10-CM | POA: Diagnosis present

## 2020-11-01 DIAGNOSIS — M5116 Intervertebral disc disorders with radiculopathy, lumbar region: Secondary | ICD-10-CM | POA: Diagnosis present

## 2020-11-01 DIAGNOSIS — M48061 Spinal stenosis, lumbar region without neurogenic claudication: Secondary | ICD-10-CM | POA: Diagnosis present

## 2020-11-01 DIAGNOSIS — Z981 Arthrodesis status: Secondary | ICD-10-CM

## 2020-11-01 LAB — ABO/RH: ABO/RH(D): B POS

## 2020-11-01 SURGERY — POSTERIOR LUMBAR FUSION 2 LEVEL
Anesthesia: General | Site: Spine Lumbar | Laterality: Left

## 2020-11-01 MED ORDER — DEXAMETHASONE SODIUM PHOSPHATE 10 MG/ML IJ SOLN
INTRAMUSCULAR | Status: DC | PRN
  Administered 2020-11-01: 10 mg via INTRAVENOUS

## 2020-11-01 MED ORDER — PROPOFOL 10 MG/ML IV BOLUS
INTRAVENOUS | Status: AC
  Filled 2020-11-01: qty 40

## 2020-11-01 MED ORDER — ACETAMINOPHEN 325 MG PO TABS: 650.0000 mg | ORAL_TABLET | ORAL | Status: DC | PRN

## 2020-11-01 MED ORDER — HYDROCODONE-ACETAMINOPHEN 7.5-325 MG PO TABS
2.0000 | ORAL_TABLET | ORAL | Status: DC | PRN
  Administered 2020-11-02: 2 via ORAL
  Filled 2020-11-01: qty 2

## 2020-11-01 MED ORDER — BISACODYL 5 MG PO TBEC: 5.0000 mg | DELAYED_RELEASE_TABLET | Freq: Every day | ORAL | Status: DC | PRN

## 2020-11-01 MED ORDER — ORAL CARE MOUTH RINSE: 15.0000 mL | Freq: Once | OROMUCOSAL | Status: AC

## 2020-11-01 MED ORDER — ACETAMINOPHEN 650 MG RE SUPP: 650.0000 mg | RECTAL | Status: DC | PRN

## 2020-11-01 MED ORDER — THROMBIN 20000 UNITS EX SOLR
CUTANEOUS | Status: DC | PRN
  Administered 2020-11-01: 20 mL via TOPICAL

## 2020-11-01 MED ORDER — ONDANSETRON HCL 4 MG/2ML IJ SOLN
INTRAMUSCULAR | Status: AC
  Filled 2020-11-01: qty 2

## 2020-11-01 MED ORDER — MENTHOL 3 MG MT LOZG: 1.0000 | LOZENGE | OROMUCOSAL | Status: DC | PRN

## 2020-11-01 MED ORDER — SUGAMMADEX SODIUM 200 MG/2ML IV SOLN
INTRAVENOUS | Status: DC | PRN
  Administered 2020-11-01: 200 mg via INTRAVENOUS

## 2020-11-01 MED ORDER — FLEET ENEMA 7-19 GM/118ML RE ENEM: 1.0000 | ENEMA | Freq: Once | RECTAL | Status: DC | PRN

## 2020-11-01 MED ORDER — ROCURONIUM BROMIDE 10 MG/ML (PF) SYRINGE
PREFILLED_SYRINGE | INTRAVENOUS | Status: DC | PRN
  Administered 2020-11-01 (×5): 50 mg via INTRAVENOUS
  Administered 2020-11-01: 100 mg via INTRAVENOUS

## 2020-11-01 MED ORDER — CEFAZOLIN SODIUM-DEXTROSE 2-4 GM/100ML-% IV SOLN
2.0000 g | Freq: Three times a day (TID) | INTRAVENOUS | Status: AC
  Administered 2020-11-02: 2 g via INTRAVENOUS
  Filled 2020-11-01 (×2): qty 100

## 2020-11-01 MED ORDER — BUPIVACAINE LIPOSOME 1.3 % IJ SUSP
20.0000 mL | INTRAMUSCULAR | Status: AC
  Administered 2020-11-01: 10 mL
  Filled 2020-11-01: qty 20

## 2020-11-01 MED ORDER — THROMBIN (RECOMBINANT) 20000 UNITS EX SOLR
CUTANEOUS | Status: AC
  Filled 2020-11-01: qty 20000

## 2020-11-01 MED ORDER — POLYETHYLENE GLYCOL 3350 17 G PO PACK: 17.0000 g | PACK | Freq: Every day | ORAL | Status: DC | PRN

## 2020-11-01 MED ORDER — METHOCARBAMOL 500 MG PO TABS
500.0000 mg | ORAL_TABLET | Freq: Four times a day (QID) | ORAL | Status: DC | PRN
  Administered 2020-11-01: 500 mg via ORAL

## 2020-11-01 MED ORDER — LACTATED RINGERS IV SOLN: INTRAVENOUS | Status: DC

## 2020-11-01 MED ORDER — METHOCARBAMOL 1000 MG/10ML IJ SOLN
500.0000 mg | Freq: Four times a day (QID) | INTRAVENOUS | Status: DC | PRN
  Filled 2020-11-01: qty 5

## 2020-11-01 MED ORDER — BUPIVACAINE HCL 0.5 % IJ SOLN
INTRAMUSCULAR | Status: DC | PRN
  Administered 2020-11-01: 10 mL

## 2020-11-01 MED ORDER — ONDANSETRON HCL 4 MG/2ML IJ SOLN
INTRAMUSCULAR | Status: DC | PRN
  Administered 2020-11-01: 4 mg via INTRAVENOUS

## 2020-11-01 MED ORDER — ZOLPIDEM TARTRATE 5 MG PO TABS
5.0000 mg | ORAL_TABLET | Freq: Every evening | ORAL | Status: DC | PRN
  Administered 2020-11-02: 5 mg via ORAL
  Filled 2020-11-01: qty 1

## 2020-11-01 MED ORDER — HYDROCODONE-ACETAMINOPHEN 7.5-325 MG PO TABS: 1.0000 | ORAL_TABLET | ORAL | Status: DC | PRN

## 2020-11-01 MED ORDER — ROCURONIUM BROMIDE 10 MG/ML (PF) SYRINGE
PREFILLED_SYRINGE | INTRAVENOUS | Status: AC
  Filled 2020-11-01: qty 10

## 2020-11-01 MED ORDER — BUPIVACAINE HCL (PF) 0.5 % IJ SOLN
INTRAMUSCULAR | Status: AC
  Filled 2020-11-01: qty 30

## 2020-11-01 MED ORDER — PROPOFOL 500 MG/50ML IV EMUL
INTRAVENOUS | Status: DC | PRN
  Administered 2020-11-01: 125 ug/kg/min via INTRAVENOUS
  Administered 2020-11-01: 150 ug/kg/min via INTRAVENOUS

## 2020-11-01 MED ORDER — ONDANSETRON HCL 4 MG PO TABS: 4.0000 mg | ORAL_TABLET | Freq: Four times a day (QID) | ORAL | Status: DC | PRN

## 2020-11-01 MED ORDER — DEXAMETHASONE SODIUM PHOSPHATE 10 MG/ML IJ SOLN
INTRAMUSCULAR | Status: AC
  Filled 2020-11-01: qty 1

## 2020-11-01 MED ORDER — SUFENTANIL CITRATE 250 MCG/5ML IV SOLN
0.2500 ug/kg/h | INTRAVENOUS | Status: AC
  Administered 2020-11-01: .25 ug/kg/h via INTRAVENOUS
  Filled 2020-11-01: qty 5

## 2020-11-01 MED ORDER — LABETALOL HCL 5 MG/ML IV SOLN
INTRAVENOUS | Status: AC
  Filled 2020-11-01: qty 4

## 2020-11-01 MED ORDER — CEFAZOLIN SODIUM-DEXTROSE 2-4 GM/100ML-% IV SOLN
INTRAVENOUS | Status: AC
  Filled 2020-11-01: qty 100

## 2020-11-01 MED ORDER — PROPOFOL 1000 MG/100ML IV EMUL
INTRAVENOUS | Status: AC
  Filled 2020-11-01: qty 500

## 2020-11-01 MED ORDER — DOCUSATE SODIUM 100 MG PO CAPS
100.0000 mg | ORAL_CAPSULE | Freq: Two times a day (BID) | ORAL | Status: DC
  Administered 2020-11-01 – 2020-11-03 (×4): 100 mg via ORAL
  Filled 2020-11-01 (×4): qty 1

## 2020-11-01 MED ORDER — SUFENTANIL CITRATE 50 MCG/ML IV SOLN
0.2500 ug/kg/h | INTRAVENOUS | Status: DC
  Filled 2020-11-01 (×3): qty 1

## 2020-11-01 MED ORDER — LIDOCAINE 2% (20 MG/ML) 5 ML SYRINGE
INTRAMUSCULAR | Status: DC | PRN
  Administered 2020-11-01: 80 mg via INTRAVENOUS

## 2020-11-01 MED ORDER — MIDAZOLAM HCL 2 MG/2ML IJ SOLN
INTRAMUSCULAR | Status: AC
  Filled 2020-11-01: qty 2

## 2020-11-01 MED ORDER — CHLORHEXIDINE GLUCONATE 0.12 % MT SOLN
OROMUCOSAL | Status: AC
  Administered 2020-11-01: 15 mL via OROMUCOSAL
  Filled 2020-11-01: qty 15

## 2020-11-01 MED ORDER — CHLORHEXIDINE GLUCONATE 0.12 % MT SOLN: 15.0000 mL | Freq: Once | OROMUCOSAL | Status: AC

## 2020-11-01 MED ORDER — PROPOFOL 10 MG/ML IV BOLUS
INTRAVENOUS | Status: DC | PRN
  Administered 2020-11-01: 180 mg via INTRAVENOUS

## 2020-11-01 MED ORDER — MORPHINE SULFATE (PF) 2 MG/ML IV SOLN: 1.0000 mg | INTRAVENOUS | Status: DC | PRN

## 2020-11-01 MED ORDER — HYDROMORPHONE HCL 1 MG/ML IJ SOLN
0.2500 mg | INTRAMUSCULAR | Status: DC | PRN
Start: 2020-11-01 — End: 2020-11-01

## 2020-11-01 MED ORDER — LACTATED RINGERS IV SOLN: INTRAVENOUS | Status: DC | PRN

## 2020-11-01 MED ORDER — AMISULPRIDE (ANTIEMETIC) 5 MG/2ML IV SOLN: 10.0000 mg | Freq: Once | INTRAVENOUS | Status: DC | PRN

## 2020-11-01 MED ORDER — ACETAMINOPHEN 500 MG PO TABS: 1000.0000 mg | ORAL_TABLET | Freq: Once | ORAL | Status: AC

## 2020-11-01 MED ORDER — SODIUM CHLORIDE 0.9% FLUSH
3.0000 mL | Freq: Two times a day (BID) | INTRAVENOUS | Status: DC
  Administered 2020-11-02: 3 mL via INTRAVENOUS

## 2020-11-01 MED ORDER — SODIUM CHLORIDE 0.9 % IV SOLN: 250.0000 mL | INTRAVENOUS | Status: DC

## 2020-11-01 MED ORDER — KETAMINE HCL 50 MG/5ML IJ SOSY
PREFILLED_SYRINGE | INTRAMUSCULAR | Status: AC
  Filled 2020-11-01: qty 10

## 2020-11-01 MED ORDER — FENTANYL CITRATE (PF) 250 MCG/5ML IJ SOLN
INTRAMUSCULAR | Status: AC
  Filled 2020-11-01: qty 5

## 2020-11-01 MED ORDER — FENTANYL CITRATE (PF) 100 MCG/2ML IJ SOLN
INTRAMUSCULAR | Status: DC | PRN
  Administered 2020-11-01: 100 ug via INTRAVENOUS

## 2020-11-01 MED ORDER — DEXMEDETOMIDINE (PRECEDEX) IN NS 20 MCG/5ML (4 MCG/ML) IV SYRINGE
PREFILLED_SYRINGE | INTRAVENOUS | Status: AC
  Filled 2020-11-01: qty 5

## 2020-11-01 MED ORDER — ALBUMIN HUMAN 5 % IV SOLN: INTRAVENOUS | Status: DC | PRN

## 2020-11-01 MED ORDER — SODIUM CHLORIDE 0.9 % IV SOLN: INTRAVENOUS | Status: DC

## 2020-11-01 MED ORDER — OXYCODONE HCL 5 MG PO TABS
ORAL_TABLET | ORAL | Status: AC
  Filled 2020-11-01: qty 1

## 2020-11-01 MED ORDER — GABAPENTIN 300 MG PO CAPS
300.0000 mg | ORAL_CAPSULE | Freq: Three times a day (TID) | ORAL | Status: DC
  Administered 2020-11-01 – 2020-11-03 (×5): 300 mg via ORAL
  Filled 2020-11-01 (×5): qty 1

## 2020-11-01 MED ORDER — METHOCARBAMOL 500 MG PO TABS
ORAL_TABLET | ORAL | Status: AC
  Filled 2020-11-01: qty 1

## 2020-11-01 MED ORDER — SUFENTANIL CITRATE 50 MCG/ML IV SOLN
INTRAVENOUS | Status: DC | PRN
  Administered 2020-11-01: 10 ug via INTRAVENOUS

## 2020-11-01 MED ORDER — LIDOCAINE 2% (20 MG/ML) 5 ML SYRINGE
INTRAMUSCULAR | Status: AC
  Filled 2020-11-01: qty 5

## 2020-11-01 MED ORDER — PHENYLEPHRINE HCL-NACL 10-0.9 MG/250ML-% IV SOLN
INTRAVENOUS | Status: AC
  Filled 2020-11-01: qty 250

## 2020-11-01 MED ORDER — SUCCINYLCHOLINE CHLORIDE 200 MG/10ML IV SOSY
PREFILLED_SYRINGE | INTRAVENOUS | Status: AC
  Filled 2020-11-01: qty 10

## 2020-11-01 MED ORDER — OXYCODONE HCL 5 MG PO TABS
5.0000 mg | ORAL_TABLET | Freq: Once | ORAL | Status: AC | PRN
  Administered 2020-11-01: 5 mg via ORAL

## 2020-11-01 MED ORDER — OXYCODONE HCL 5 MG/5ML PO SOLN: 5.0000 mg | Freq: Once | ORAL | Status: AC | PRN

## 2020-11-01 MED ORDER — MIDAZOLAM HCL 5 MG/5ML IJ SOLN
INTRAMUSCULAR | Status: DC | PRN
  Administered 2020-11-01: 2 mg via INTRAVENOUS

## 2020-11-01 MED ORDER — PROMETHAZINE HCL 25 MG/ML IJ SOLN
6.2500 mg | INTRAMUSCULAR | Status: DC | PRN
Start: 2020-11-01 — End: 2020-11-01

## 2020-11-01 MED ORDER — CEFAZOLIN SODIUM-DEXTROSE 2-4 GM/100ML-% IV SOLN
2.0000 g | INTRAVENOUS | Status: AC
  Administered 2020-11-01 (×2): 2 g via INTRAVENOUS

## 2020-11-01 MED ORDER — CELECOXIB 200 MG PO CAPS
200.0000 mg | ORAL_CAPSULE | Freq: Two times a day (BID) | ORAL | Status: DC
  Administered 2020-11-01 – 2020-11-03 (×4): 200 mg via ORAL
  Filled 2020-11-01 (×4): qty 1

## 2020-11-01 MED ORDER — DEXMEDETOMIDINE (PRECEDEX) IN NS 20 MCG/5ML (4 MCG/ML) IV SYRINGE
PREFILLED_SYRINGE | INTRAVENOUS | Status: DC | PRN
  Administered 2020-11-01: 10 ug via INTRAVENOUS

## 2020-11-01 MED ORDER — ACETAMINOPHEN 500 MG PO TABS
ORAL_TABLET | ORAL | Status: AC
  Administered 2020-11-01: 1000 mg via ORAL
  Filled 2020-11-01: qty 2

## 2020-11-01 MED ORDER — HYDROCODONE-ACETAMINOPHEN 7.5-325 MG PO TABS
1.0000 | ORAL_TABLET | Freq: Four times a day (QID) | ORAL | Status: DC
Start: 2020-11-01 — End: 2020-11-03
  Administered 2020-11-01 – 2020-11-03 (×7): 1 via ORAL
  Filled 2020-11-01 (×8): qty 1

## 2020-11-01 MED ORDER — ONDANSETRON HCL 4 MG/2ML IJ SOLN: 4.0000 mg | Freq: Four times a day (QID) | INTRAMUSCULAR | Status: DC | PRN

## 2020-11-01 MED ORDER — SODIUM CHLORIDE 0.9% FLUSH: 3.0000 mL | INTRAVENOUS | Status: DC | PRN

## 2020-11-01 MED ORDER — PHENOL 1.4 % MT LIQD: 1.0000 | OROMUCOSAL | Status: DC | PRN

## 2020-11-01 MED ORDER — PANTOPRAZOLE SODIUM 40 MG IV SOLR
40.0000 mg | Freq: Every day | INTRAVENOUS | Status: DC
  Administered 2020-11-01 – 2020-11-02 (×2): 40 mg via INTRAVENOUS
  Filled 2020-11-01 (×2): qty 40

## 2020-11-01 MED ORDER — ALUM & MAG HYDROXIDE-SIMETH 200-200-20 MG/5ML PO SUSP: 30.0000 mL | Freq: Four times a day (QID) | ORAL | Status: DC | PRN

## 2020-11-01 MED ORDER — KETAMINE HCL 10 MG/ML IJ SOLN
INTRAMUSCULAR | Status: DC | PRN
  Administered 2020-11-01 (×4): 10 mg via INTRAVENOUS
  Administered 2020-11-01: 40 mg via INTRAVENOUS

## 2020-11-01 SURGICAL SUPPLY — 75 items
BLADE CLIPPER SURG (BLADE) ×2 IMPLANT
BONE CANC CHIPS 20CC PCAN1/4 (Bone Implant) ×2 IMPLANT
BONE VIVIGEN FORMABLE 10CC (Bone Implant) ×2 IMPLANT
BUR MATCHSTICK NEURO 3.0 LAGG (BURR) ×2 IMPLANT
BUR RND FLUTED 2.5 (BURR) IMPLANT
BUR SABER RD CUTTING 3.0 (BURR) IMPLANT
CAGE SABLE 10X30 10-17 15D (Cage) ×2 IMPLANT
CAP LOCKING THREADED (Cap) ×12 IMPLANT
CHIPS CANC BONE 20CC PCAN1/4 (Bone Implant) ×1 IMPLANT
COVER BACK TABLE 80X110 HD (DRAPES) ×2 IMPLANT
COVER MAYO STAND STRL (DRAPES) ×2 IMPLANT
COVER SURGICAL LIGHT HANDLE (MISCELLANEOUS) ×2 IMPLANT
COVER WAND RF STERILE (DRAPES) ×2 IMPLANT
DERMABOND ADVANCED (GAUZE/BANDAGES/DRESSINGS)
DERMABOND ADVANCED .7 DNX12 (GAUZE/BANDAGES/DRESSINGS) IMPLANT
DRAPE C-ARM 42X72 X-RAY (DRAPES) ×2 IMPLANT
DRAPE C-ARMOR (DRAPES) ×2 IMPLANT
DRAPE MICROSCOPE LEICA (MISCELLANEOUS) ×2 IMPLANT
DRAPE SURG 17X23 STRL (DRAPES) ×8 IMPLANT
DRSG MEPILEX BORDER 4X4 (GAUZE/BANDAGES/DRESSINGS) IMPLANT
DRSG MEPILEX BORDER 4X8 (GAUZE/BANDAGES/DRESSINGS) ×2 IMPLANT
DURAPREP 26ML APPLICATOR (WOUND CARE) ×2 IMPLANT
ELECT BLADE 6.5 EXT (BLADE) IMPLANT
ELECT CAUTERY BLADE 6.4 (BLADE) ×2 IMPLANT
ELECT REM PT RETURN 9FT ADLT (ELECTROSURGICAL) ×2
ELECTRODE REM PT RTRN 9FT ADLT (ELECTROSURGICAL) ×1 IMPLANT
EVACUATOR 1/8 PVC DRAIN (DRAIN) IMPLANT
GLOVE ECLIPSE 9.0 STRL (GLOVE) ×2 IMPLANT
GLOVE ORTHO TXT STRL SZ7.5 (GLOVE) ×2 IMPLANT
GLOVE SRG 8 PF TXTR STRL LF DI (GLOVE) ×1 IMPLANT
GLOVE SURG 8.5 LATEX PF (GLOVE) ×2 IMPLANT
GLOVE SURG LTX SZ7 (GLOVE) ×2 IMPLANT
GLOVE SURG UNDER POLY LF SZ7.5 (GLOVE) ×2 IMPLANT
GLOVE SURG UNDER POLY LF SZ8 (GLOVE) ×1
GOWN STRL REUS W/ TWL LRG LVL3 (GOWN DISPOSABLE) ×1 IMPLANT
GOWN STRL REUS W/TWL 2XL LVL3 (GOWN DISPOSABLE) ×10 IMPLANT
GOWN STRL REUS W/TWL LRG LVL3 (GOWN DISPOSABLE) ×1
INTERBODY SABLE 10X26 7-14 15D (Miscellaneous) ×2 IMPLANT
KIT BASIN OR (CUSTOM PROCEDURE TRAY) ×2 IMPLANT
KIT POSITION SURG JACKSON T1 (MISCELLANEOUS) ×2 IMPLANT
KIT TURNOVER KIT B (KITS) ×2 IMPLANT
MANIFOLD NEPTUNE II (INSTRUMENTS) ×2 IMPLANT
MARKER SKIN DUAL TIP RULER LAB (MISCELLANEOUS) ×2 IMPLANT
NEEDLE 22X1 1/2 (OR ONLY) (NEEDLE) ×2 IMPLANT
NEEDLE SPNL 18GX3.5 QUINCKE PK (NEEDLE) ×2 IMPLANT
NS IRRIG 1000ML POUR BTL (IV SOLUTION) ×2 IMPLANT
PACK LAMINECTOMY ORTHO (CUSTOM PROCEDURE TRAY) ×2 IMPLANT
PAD ARMBOARD 7.5X6 YLW CONV (MISCELLANEOUS) ×4 IMPLANT
PATTIES SURGICAL .5 X.5 (GAUZE/BANDAGES/DRESSINGS) ×2 IMPLANT
PATTIES SURGICAL .75X.75 (GAUZE/BANDAGES/DRESSINGS) ×2 IMPLANT
PATTIES SURGICAL 1X1 (DISPOSABLE) ×2 IMPLANT
ROD 65MM SPINAL (Rod) ×1 IMPLANT
ROD 70MM SPINAL (Rod) ×2 IMPLANT
ROD SPNL 5.5 CREO TI 65 (Rod) ×1 IMPLANT
SCREW CREO MODULAR 7.5X45 (Screw) ×12 IMPLANT
SCREW PA THRD CREO TULIP 5.5X4 (Head) ×12 IMPLANT
SPONGE LAP 4X18 RFD (DISPOSABLE) ×4 IMPLANT
SPONGE SURGIFOAM ABS GEL 100 (HEMOSTASIS) ×2 IMPLANT
STAPLER VISISTAT 35W (STAPLE) ×2 IMPLANT
SURGIFLO W/THROMBIN 8M KIT (HEMOSTASIS) IMPLANT
SUT VIC AB 0 CT1 27 (SUTURE) ×1
SUT VIC AB 0 CT1 27XBRD ANBCTR (SUTURE) ×1 IMPLANT
SUT VIC AB 1 CTX 36 (SUTURE) ×2
SUT VIC AB 1 CTX36XBRD ANBCTR (SUTURE) ×2 IMPLANT
SUT VIC AB 2-0 CT1 27 (SUTURE) ×1
SUT VIC AB 2-0 CT1 TAPERPNT 27 (SUTURE) ×1 IMPLANT
SUT VIC AB 3-0 X1 27 (SUTURE) ×2 IMPLANT
SYR 20ML LL LF (SYRINGE) ×2 IMPLANT
SYR CONTROL 10ML LL (SYRINGE) ×4 IMPLANT
SYR TB 1ML LUER SLIP (SYRINGE) ×2 IMPLANT
TOWEL GREEN STERILE (TOWEL DISPOSABLE) ×2 IMPLANT
TOWEL GREEN STERILE FF (TOWEL DISPOSABLE) ×2 IMPLANT
TRAY FOLEY MTR SLVR 16FR STAT (SET/KITS/TRAYS/PACK) ×2 IMPLANT
WATER STERILE IRR 1000ML POUR (IV SOLUTION) ×2 IMPLANT
YANKAUER SUCT BULB TIP NO VENT (SUCTIONS) ×2 IMPLANT

## 2020-11-01 NOTE — Progress Notes (Signed)
Orthopedic Tech Progress Note Patient Details:  Jamie Baldwin [birth year] [id number] PACU RN called requesting an ASPEN LUMBAR BRACE. Called in order to [facility] for that brace Patient ID: Jamie Baldwin, male   DOB: 21-May-1986, 35 y.o.   MRN: [id number]   Jamie Baldwin 11/01/2020, 3:16 PM

## 2020-11-01 NOTE — H&P (Signed)
Jamie Baldwin is an 35 y.o. male.   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  States that symptoms unchanged previous visit.  He is wanting to proceed with LEFT L4-5 AND L5-S1 LEFT TRANSFORAMINAL LUMBAR INTERBODY FUSION WITH PEDICLE SCREWS, RODS AND CAGES, LOCAL AND ALLOGRAFT BONE GRAFT, VIVIGEN.   Past Medical History:  Diagnosis Date  . Allergic rhinitis due to pollen   . Lumbar stenosis 2021    Past Surgical History:  Procedure Laterality Date  . WISDOM TOOTH EXTRACTION      Family History  Problem Relation Age of Onset  . Diabetes Neg Hx   . Cancer Neg Hx   . Heart disease Neg Hx    Social History:  reports that he quit smoking about 3 years ago. He quit smokeless tobacco use about 14 years ago. He reports current alcohol use. He reports that he does not use drugs.  Allergies: No Known Allergies  Medications Prior to Admission  Medication Sig Dispense Refill  . ibuprofen (ADVIL) 200 MG tablet Take 600 mg by mouth every 8 (eight) hours as needed (pain).    Marland Kitchen gabapentin (NEURONTIN) 300 MG capsule Take 1 capsule (300 mg total) by mouth at bedtime for 3 days, THEN 1 capsule (300 mg total) 2 (two) times daily for 3 days, THEN 1 capsule (300 mg total) 3 (three) times daily for 3 days. (Patient not taking: Reported on 10/25/2020) 18 capsule 0  . methylPREDNISolone (MEDROL DOSEPAK) 4 MG TBPK tablet Take a 6 day dose pak as directed. 21 tablet 0  . traMADol (ULTRAM) 50 MG tablet Take 2 tablets (100 mg total) by mouth every 6 (six) hours as needed. 40 tablet 0    No results found for this or any previous visit (from the past 48 hour(s)). No results found.  Review of Systems  Constitutional: Positive for activity change.  HENT: Negative.   Respiratory: Negative.   Cardiovascular: Negative.   Gastrointestinal:  Negative.   Genitourinary: Negative.   Musculoskeletal: Positive for back pain.  Neurological: Positive for numbness.  Psychiatric/Behavioral: Negative.     Blood pressure (!) 133/94, pulse 77, temperature 97.8 F (36.6 C), temperature source Oral, resp. rate 17, height 5\' 11"  (1.803 m), weight 98.3 kg, SpO2 96 %. Physical Exam HENT:     Head: Normocephalic and atraumatic.     Nose: Nose normal.  Eyes:     Extraocular Movements: Extraocular movements intact.     Pupils: Pupils are equal, round, and reactive to light.  Cardiovascular:     Rate and Rhythm: Regular rhythm.     Heart sounds: Normal heart sounds.  Pulmonary:     Effort: No respiratory distress.  Abdominal:     General: There is no distension.  Musculoskeletal:        General: Tenderness present.     Cervical back: Normal range of motion.  Neurological:     Mental Status: He is alert and oriented to person, place, and time.  Psychiatric:        Mood and Affect: Mood normal.      Assessment/Plan L4-5, L5-S1 HNP/Stenosis  We will proceed with surgery as scheduled. All questions answered.    , PA-C 11/01/2020, 6:56 AM

## 2020-11-01 NOTE — Brief Op Note (Signed)
11/01/2020  2:12 PM  PATIENT:  Jamie Baldwin  35 y.o. male  PRE-OPERATIVE DIAGNOSIS:  lumbar degenerative disc disease L4-5 and L5-S1  POST-OPERATIVE DIAGNOSIS:  lumbar degenerative disc disease L4-5 and L5-S1  PROCEDURE:  Procedure(s): LEFT L4-5 AND L5-S1 LEFT TRANSFORAMINAL LUMBAR INTERBODY FUSION WITH PEDICLE SCREWS, RODS AND CAGES, LOCAL AND ALLOGRAFT BONE GRAFT, VIVIGEN (Left)  SURGEON:  Surgeon(s) and Role:    * Kerrin Champagne, MD - Primary  PHYSICIAN ASSISTANT: Zonia Kief, PA-C  ANESTHESIA:   local and general  EBL:  500 mL   BLOOD ADMINISTERED:200 CC CELLSAVER  DRAINS: Urinary Catheter (Foley)   LOCAL MEDICATIONS USED:  MARCAINE 0.5% 1:1 EXPAREL 1.3%    Amount: 20cc ml  SPECIMEN:  No Specimen  DISPOSITION OF SPECIMEN:  N/A  COUNTS:  YES  TOURNIQUET:  * No tourniquets in log *  DICTATION: .Dragon Dictation  PLAN OF CARE: Admit to inpatient   PATIENT DISPOSITION:  PACU - hemodynamically stable.   Delay start of Pharmacological VTE agent (>24hrs) due to surgical blood loss or risk of bleeding: yes

## 2020-11-01 NOTE — Progress Notes (Signed)
Orthopedic Tech Progress Note Patient Details:  Jamie Baldwin [birth year] [id number] Went to deliver LSO to pt bedside and pt already had a brace, says nurse in PACU supplied him with it. Patient ID: Jamie Baldwin, male   DOB: 07/30/1986, 35 y.o.   MRN: [id number]   Gerald Stabs 11/01/2020, 10:25 PM

## 2020-11-01 NOTE — Transfer of Care (Signed)
Immediate Anesthesia Transfer of Care Note  Patient: Jamie Baldwin  Procedure(s) Performed: LEFT L4-5 AND L5-S1 LEFT TRANSFORAMINAL LUMBAR INTERBODY FUSION WITH PEDICLE SCREWS, RODS AND CAGES, LOCAL AND ALLOGRAFT BONE GRAFT, VIVIGEN (Left Spine Lumbar)  Patient Location: PACU  Anesthesia Type:General  Level of Consciousness: awake and drowsy  Airway & Oxygen Therapy: Patient Spontanous Breathing and Patient connected to face mask oxygen  Post-op Assessment: Report given to RN and Post -op Vital signs reviewed and stable  Post vital signs: Reviewed and stable  Last Vitals:  Vitals Value Taken Time  BP 127/86 11/01/20 1415  Temp    Pulse 95 11/01/20 1416  Resp 13 11/01/20 1416  SpO2 83 % 11/01/20 1416  Vitals shown include unvalidated device data.  Last Pain:  Vitals:   11/01/20 0621  TempSrc:   PainSc: 5       Patients Stated Pain Goal: 3 (11/01/20 4388)  Complications: No complications documented.

## 2020-11-01 NOTE — Op Note (Signed)
11/01/2020  2:16 PM  PATIENT:  Jamie Baldwin  35 y.o. male  MRN: [id number]  OPERATIVE REPORT  PRE-OPERATIVE DIAGNOSIS:  lumbar degenerative disc disease L4-5 and L5-S1  POST-OPERATIVE DIAGNOSIS:  lumbar degenerative disc disease L4-5 and L5-S1  PROCEDURE:  Procedure(s): LEFT L4-5 AND L5-S1 LEFT TRANSFORAMINAL LUMBAR INTERBODY FUSION WITH PEDICLE SCREWS, RODS AND CAGES, LOCAL AND ALLOGRAFT BONE GRAFT, VIVIGEN    SURGEON:  Jessy Oto, MD     ASSISTANT:  Benjiman Core, PA-C  (Present throughout the entire procedure and necessary for complettion of procedure in a timely manner)     ANESTHESIA:  General, supplemented with local marcaine 1/2% 1:1 exparel 1.3% total 20cc. Dr. Norton Blizzard  EBL [place] BLOOD RETURNED: 200CC  DRAINS: Foley to SD.     COMPLICATIONS:  None.     COMPONENTS:  Implant Name Type Inv. Item Serial No. Manufacturer Lot No. LRB No. Used Action  CAP LOCKING THREADED - [id number] Cap CAP LOCKING THREADED  [employer] ON TRAY N/A 6 Implanted  HEAD CREO - [id number] Head HEAD CREO  [employer] N/A 6 Implanted  SCREW CREO MODULAR 7.5X45 - [id number] Screw SCREW CREO MODULAR 7.5X45  [employer] TRAY Left 6 Implanted  BONE VIVIGEN FORMABLE 10CC - [id number] Bone Implant BONE VIVIGEN FORMABLE 10CC [id number] Axis [id number] Left 1 Implanted  BONE [facility] CHIPS 20CC - [id number] Bone Implant BONE [facility] CHIPS 20CC [id number] [place] [id number] Left 1 Implanted  CAGE SABLE 10X30 10-17 15D - [id number] Cage CAGE SABLE 10X30 10-17 15D  [employer] [id number] Left 1 Implanted  INTERBODY SABLE 10X26 7-14 15D - [id number] Miscellaneous INTERBODY SABLE 10X26 7-14 15D  [employer] [id number] Left 1 Implanted  ROD 70MM SPINAL - [id number] Rod ROD 70MM SPINAL  [employer] TRAY Left 1 Implanted  ROD 65MM SPINAL - [id number] Rod ROD 65MM SPINAL  [employer] TRAY Left 1 Implanted     PROCEDURE:    The patient was  met in the holding area, and the appropriate left L4-5 and L5-S1 lumbar levels identified and marked with an "X" and my initials. I had discussion with the patient in the preop holding area regarding consent form. Patient understands the rationale for the fusion site as the L3-4 and L4-5 segment For stenosis and degenerative disc disease.  The patient was then transported to OR and was placed under general anestheticwithout difficulty. The patient received appropriate preoperative antibiotic prophylaxis ancef 2gm. . Nursing staff inserted a Foley catheter under sterile conditions. The patient was then turned to a prone position using the [place] spine frame. PAS. all pressure points well padded the arms at the side to 90 90. Standard prep with DuraPrep solution draped in the usual manner from the lower dorsal spine the mid sacral segment. Iodine Vi-Drape was used and the incision was marked. Time-out procedure was called and correct. Skin in the midline between L3 and S2 was then infiltrated with Marcaine half percent with 1-200,000 epinephrine total of 10 cc used. Incision was then made through the skin and subcutaneous layers down to the patient's lumbodorsal fascia and spinous processes. The incision then carried sharply excising the supraspinous ligament and then continuing the lateral aspect of the spinous processes L3 L4 L5 and S1Cobb elevators used to carefully elevate the paralumbar muscles off of the posterior elements using electrocautery carefully drilled bleeding and perform dissection of the muscle tissues of the preserving the facet at L3-4. Continuing the exposure out  laterally to expose the facets of L3-4 L4-5 and L5-S1 in the midline and out to the sacra ala bilaterally incision was carried in the midline down to the S1 level area bleeders controlled using electrocautery monopolar electrocautery.The level marked with the OR marking pen sterilely. Continued exposure then carried down to the  lateral aspect of the L4 lamina and continued on both sides down to the lumbosacral junction. Cerebellar and self retaining retractor was used for the upper part of the incision. Leksell rongeur used to resect inferior aspect of the lamina on the left side at the L4 level and partially on the left side at L5. The left inferior articular process of L4 and L5 were resected in order to provide for exposure of the left side L4-5 and L5-S1 neuroforamen for ease of placement of TLIFs (transforaminal lumbar interbody fusion) essential portions of the lamina were also resected first beginning with the Leksell rongeur and then resecting using 2 and 3 mm Kerrison.Laminectomy was carried out resecting the inferior portions of the lamina of L4 and L5 performing foraminotomies on the left side at the L5 level.The left L5 nerve root identified bilaterally and the medial aspect of the L5 pedicle. Superior articular process of L5 was then resected from the left side further decompressing the left L4 nerve and L5 nerve providing for exposure of the area just superior to the left L5 pedicle for a placement of cage. Returning to the left side decompression was carried out along the left side recut resecting the superior articular process of S1 overlying the L5 nerve root as it exited at the L5-S1 level decompressing the lateral recess along the medial aspect of the pedicle of S1 and resecting the superior to the process of S1 and decompressing the left L5 and S1 neuroforamen. Loupe magnification and headlight were used during this portion procedure. C-arm fluoroscopy was then brought into the field and using C-arm fluoroscopy then a hole made into the lateral aspect of the pedicle of L4 observed in the pedicle using ball-tipped nerve hook and hockey stick nerve probe initial entry was determined on fluoroscopy to be good position alignment so that a ball handled probe was then used to probe the left L4 pedicle to a depth of nearly 45  mm observed on C-arm fluoroscopy to be beyond the midpoint of the lumbar vertebra and then position alignment within the left L4 pedicle this was then removed and the pedicle channel probed demonstrating patency no sign of rupture the cortex of the pedicle. Tapping with a 5.5 mm screw tap then 6.5 mm and 7.62m tap a 7.558mx 45 mm screw shank was placed on the left side at the L4 level. C-arm fluoroscopy was then brought into the field and using C-arm fluoroscopy then a hole made into the mediall aspect of the right pedicle of L4 observed in the pedicle using ball tipped nerve hook and hockey stick nerve probe initial entry was determined on fluoroscopy to be good position alignment so that a ball handled probe was then used to probe the right L4 pedicle to a depth of nearly 45 mm observed on C-arm fluoroscopy to be beyond the midpoint of the lumbar vertebra and then position alignment within the right L4 pedicle this was then removed and the pedicle channel probed demonstrating patency no sign of rupture the cortex of the pedicle. Tapping with a 5.52m48mhen 6.5 mm then 7.52mm17mrew tap then 7.5 mm x 45 mm screw was placed on the right  side at the L4 level C-arm fluoroscopy was then brought into the field and using C-arm fluoroscopy then a hole made into the lateral aspect of the pedicle of left L5 observed in the pedicle using ball tipped nerve hook and hockey stick nerve probe initial entry was determined on fluoroscopy to be good position alignment so that a ball handled probe was then used to probe the left L5 pedicle to a depth of nearly 45 mm observed on C-arm fluoroscopy to be well aligned within the left L5 pedicle, the pedicle channel probed demonstrating patency no sign of rupture the cortex of the pedicle. Tapping with a 6.5 mm screw tap the a 7.2m tap then 7.5 mm x 45 mm screw shank was placed on the left side at the L5 level. C-arm fluoroscopy was used to localize the hole made in the lateral aspect of  the pedicle of L5 on the right localizing the pedicle within the spinal canal with nerve hook and hockey-stick nerve probe carefully passed down the center of the L5 pedicle to a depth of nearly 45 mm. Observed on C-arm fluoroscopy to be in good position alignment channel was probed with a ball-tipped probe ensure patency no sign of cortical disruption. Following tapping with a 4.5 mm, then 5.513m 6.5 mm and then 7.27m11mcrew tap a 7.0 x 45 mm screw was placed on the right side pedicle at L5.C-arm fluoroscopy was used to localize the hole made in the lateral aspect of the pedicle of S1 on the left localizing the pedicle within the spinal canal with nerve hook and hockey-stick nerve probe carefully passed down the center of the S1 pedicle to a depth of nearly 45 mm. Observed on C-arm fluoroscopy to be in good position alignment channel was probed with a ball-tipped probe ensure patency no sign of cortical disruption. Following tappinwith a 5.27mm87mp then 6.27mm 56m and a 7.5 mm tap and a 7.5 x 45 mm screw shank was placed on the lef side pedicle at S1. C-arm fluoroscopy was used to localize the hole made in the lateral aspect of the pedicle of S1 on the right localizing the pedicle within the spinal canal with nerve hook and hockey-stick nerve probe carefully passed down the center of the L5 pedicle to a depth of nearly 45 mm. Observed on C-arm fluoroscopy to be in good position alignment channel was probed with a ball-tipped probe ensure patency no sign of cortical disruption. Following tapping with a 6 mm tap and a 7.0 x 45 mm screw was placed on the right side pedicle at S1 Attention then turned to placement of the transforaminal lumbar interbody fusion cages. Using a Penfield 4 the lateral aspect of the thecal sac and the inferior aspect of theL4 nerve root on the lefdddt side at the L4-5 level was carefully drilled. The thecal sac could then easily be retracted in the posterior lateral aspect of the L4-5 disc was  exposed 15 blade scalpel used to incise the osteotome used to resect a small portion of bone off the superior aspect of the posterior superior vertebral body of L5 in order to ease the entry into the L4-5 disc space. A pituitary rongeur was then able to be introduced in the disc space debrided it of degenerative disc material. 7 mm 8mm 96m 121mnd 11mm sh29m used to dialate the L4-5 disc space on the left side attempts were made to dilate further in intrements to 11mm suc47mfully and using small curettes and the disc  space was debrided a minimal degenerative disc present in the endplates debrided to bleeding endplate bone. A 10-15 mmx 373m adjustable sable cage and careful packed with morcellized bone graft and the been harvested from previous laminotomies additional bone graft was then packed into the intervertebral disc space using the 10 mm trial impacted the graft multiple times. With this then a 10-15 mmx30 15degree lorditic cage was introdudced into the disc space on the left side in the correct degree convergence and then impacted then subset beneath the posterior aspect of the disc space by about 3 or 4 mm. Bleeding controlled using bipolar electrocautery thrombin soaked gel cottonoids. Then turned to the left L5-S1 level similarly the exposure the posterior lateral aspect this was carried out using a Penfield 4 bipolar electrocautery to control small bleeders present. Derricho retractor used to retract the thecal sac and nerve root, a 15 blade scalpel was used to incise posterior lateral aspect of the disc at the L5-S1 disc space.The space was debrided of degenerative disc material using pituitary along root the entire disc space was then debrided of degenerative disc material using pituitary rongeurs curettage down to bleeding bone endplates. Residual disc was resected using pituitary. This space was then carefully sounded to a 7-14 mmx 228madjustable15 degree lordotic sable cage trial provided the best  fit the lordotic cage was chosen. The intervertebral disc space was then packed with autogenous local bone graft that been harvested from the central laminectomy and the trial was used to pack the graft using an 8 mm trial cage. This provided excellent bone graft within the intervertebral disc space at L5-S1 so that the permanent 73m36m4 mm cage by 26 mm was then packed with local bone graft placed into the intervertebral disc space and impacted into place in the correct degree of convergence. Bleeding controlled using bipolar electrocautery. The cage was subset beneath the posterior aspect of the about 3-4 mm . Bleeding was hemostasis attention was turned to the left side where on the left side portion of the laminectomy at the L4-5 and L5-S1 levels carefully decompressing thecal sac and the appropriate nerve root L4, L5, S1at the L4-5 and L5-S1 levels. Observed on C-arm fluoroscopy to be in good position alignment. With this then the transforaminal lumbar interbody fusion portion of the case was completed bleeders were controlled using bipolar electrocautery thrombin-soaked Gelfoam were appropriate. 3 screw shanks on the left then had the tulip fasteners applied and 3 screws on the right and then each carefully aligned and tightened or loose and a slight amount to allow for placement of rods. The 46m49mecontoured rod was then placed into the pedicle screws on the left extending from L4-S1 each of the caps carefully placed loosely tightened. Attention turned to the right side were similarly and then screws were carefully adjusted to allow for a better pattern screws to allow for placement of fixation rod template for the rod was then taken and a precontoured quarter inch titanium rod was placed. The right L4 rod fastener cap was then tightened 85 pounds similarly on  At the right side disc space at L4-5 and L5-S1 screw fasteners compression was obtained on the right side between L4 and L5. And at L5 and S1 by  compressing between the facets right hand the screw caps tightened to 85 pounds. Similarly this was done on the left side at L4 and L5 screws were slightly compressed and tightened 85 pounds. Returning then to the L4-5 level compression was obtained The  screw L4 fasteners caps and the S1 fasteners caps were tightened to 85 foot-pounds tIirrigation was carried out with copious amounts of saline solution this was done throughout the case. Cell Saver was used during the case and total of 200 cc of cell saver blood was returned. Center screw on the cross-link was then carefully tightened 80 foot-pounds irrigation was carried out observation showed no dural tear demonstrated no leakage present. Permanent C-arm images were obtained in AP and lateral plane. Remaining local bone graft was then applied along the left and right lateral posterior lateral region extending from L3 through L5 transverse processes. Excess Gelfoam was then removed the lumbodorsal musculature carefully exam debrided of any devitalized tissue following removal of Vicryl retractors were the bleeders were controlled using electrocautery and the area dorsal lumbar muscle were then approximated in the midline with interrupted #1 Vicryl sutures loose the dorsal fascia was re\re attached to the spinous process of L2 to superiorly and asked to history inferiorly this was done with #1 Vicryl sutures.Skin was closed with a stainless steel staples then MedPlex bandage. All instrument and sponge counts were correct. The patient was then returned to a supine position on her bed reactivated extubated and returned to the recovery room in satisfactory condition.    Benjiman Core, PA-C perform the duties of assistant surgeon during this case. He was present from the beginning of the case to the end of the case assisting in transfer the patient from his stretcher to the OR table and back to the stretcher at the end of the case. Assisted in careful retraction and  suction of the laminectomy site delicate neural structures operating under the operating room microscope. He performed closure of the incision from the fascia to the skin applying the dressing.     Basil Dess  11/01/2020, 2:16 PM

## 2020-11-01 NOTE — Anesthesia Postprocedure Evaluation (Signed)
Anesthesia Post Note  Patient: Jamie Baldwin  Procedure(s) Performed: LEFT L4-5 AND L5-S1 LEFT TRANSFORAMINAL LUMBAR INTERBODY FUSION WITH PEDICLE SCREWS, RODS AND CAGES, LOCAL AND ALLOGRAFT BONE GRAFT, VIVIGEN (Left Spine Lumbar)     Patient location during evaluation: PACU Anesthesia Type: General Level of consciousness: awake Pain management: pain level controlled Vital Signs Assessment: post-procedure vital signs reviewed and stable Respiratory status: spontaneous breathing and respiratory function stable Cardiovascular status: stable Postop Assessment: no apparent nausea or vomiting Anesthetic complications: no   No complications documented.  Last Vitals:  Vitals:   11/01/20 1530 11/01/20 1545  BP: 130/89 123/80  Pulse: 79 77  Resp: 16 10  Temp:  36.9 C  SpO2: 95% 94%    Last Pain:  Vitals:   11/01/20 1545  TempSrc:   PainSc: 0-No pain                 Mellody Dance

## 2020-11-01 NOTE — Interval H&P Note (Signed)
History and Physical Interval Note:  11/01/2020 7:42 AM  Jamie Baldwin  has presented today for surgery, with the diagnosis of lumbar degenerative disc disease L4-5 and L5-S1.  The various methods of treatment have been discussed with the patient and family. After consideration of risks, benefits and other options for treatment, the patient has consented to  Procedure(s): LEFT L4-5 AND L5-S1 LEFT TRANSFORAMINAL LUMBAR INTERBODY FUSION WITH PEDICLE SCREWS, RODS AND CAGES, LOCAL AND ALLOGRAFT BONE GRAFT, VIVIGEN (N/A) as a surgical intervention.  The patient's history has been reviewed, patient examined, no change in status, stable for surgery.  I have reviewed the patient's chart and labs.  Questions were answered to the patient's satisfaction.     Vira Browns

## 2020-11-01 NOTE — Anesthesia Procedure Notes (Signed)
Procedure Name: Intubation Date/Time: 11/01/2020 7:56 AM Performed by: Lytle Michaels, CRNA Pre-anesthesia Checklist: Patient identified, Emergency Drugs available, Suction available and Patient being monitored Patient Re-evaluated:Patient Re-evaluated prior to induction Oxygen Delivery Method: Circle system utilized Preoxygenation: Pre-oxygenation with 100% oxygen Induction Type: IV induction Ventilation: Mask ventilation without difficulty Laryngoscope Size: Miller and 2 Tube type: Oral Tube size: 7.5 mm Number of attempts: 1 Airway Equipment and Method: Stylet and Oral airway Placement Confirmation: ETT inserted through vocal cords under direct vision,  positive ETCO2 and breath sounds checked- equal and bilateral Secured at: 24 cm Tube secured with: Tape Dental Injury: Teeth and Oropharynx as per pre-operative assessment

## 2020-11-02 LAB — CBC
HCT: 36.9 % — ABNORMAL LOW (ref 39.0–52.0)
Hemoglobin: 12.8 g/dL — ABNORMAL LOW (ref 13.0–17.0)
MCH: 30.6 pg (ref 26.0–34.0)
MCHC: 34.7 g/dL (ref 30.0–36.0)
MCV: 88.3 fL (ref 80.0–100.0)
Platelets: 247 10*3/uL (ref 150–400)
RBC: 4.18 MIL/uL — ABNORMAL LOW (ref 4.22–5.81)
RDW: 11.9 % (ref 11.5–15.5)
WBC: 18.5 10*3/uL — ABNORMAL HIGH (ref 4.0–10.5)
nRBC: 0 % (ref 0.0–0.2)

## 2020-11-02 LAB — BASIC METABOLIC PANEL
Anion gap: 12 (ref 5–15)
BUN: 12 mg/dL (ref 6–20)
CO2: 22 mmol/L (ref 22–32)
Calcium: 8.8 mg/dL — ABNORMAL LOW (ref 8.9–10.3)
Chloride: 102 mmol/L (ref 98–111)
Creatinine, Ser: 0.84 mg/dL (ref 0.61–1.24)
GFR, Estimated: >60 mL/min (ref >60)
Glucose, Bld: 163 mg/dL — ABNORMAL HIGH (ref 70–99)
Potassium: 4 mmol/L (ref 3.5–5.1)
Sodium: 136 mmol/L (ref 135–145)

## 2020-11-02 MED FILL — Thrombin (Recombinant) For Soln 20000 Unit: CUTANEOUS | Qty: 1 | Status: AC

## 2020-11-02 NOTE — Evaluation (Signed)
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 No other significant PMH noted. Pt underwent L4-S1 TLIF on 11/01/2020.   Clinical Impression   Pt admitted with the above diagnoses and presents with below problem list. Pt will benefit from continued acute OT to address the below listed deficits and maximize independence with basic ADLs prior to d/c home. At baseline pt is independent with basic ADLs, utilizes [facility] at work for longer distances and stairs. Pt limited by 10/10 FACEs pain with mobility but able to complete in room mobility with extra time/effort, standing rest break and utilizing rw for a bit to offload for comfort. Began ADLs education and provided back precautions handout. Feel as pt's pain level decreases he will progress well with therapies.      Follow Up Recommendations  No OT follow up;Supervision - Intermittent (OOB/mobility)    Equipment Recommendations  3 in 1 bedside commode    Recommendations for Other Services       Precautions / Restrictions Precautions Precautions: Fall;Back Precaution Booklet Issued: Yes (comment) Precaution Comments: PT verbally reviewed back precautions with patient Required Braces or Orthoses: Spinal Brace Spinal Brace: Lumbar corset;Applied in sitting position Restrictions Weight Bearing Restrictions: No      Mobility Bed Mobility Overal bed mobility: Needs Assistance Bed Mobility: Rolling;Sidelying to Sit;Sit to Sidelying Rolling: Supervision Sidelying to sit: Min guard     Sit to sidelying: Min guard General bed mobility comments: 6/10 pain at start of session to come to EOB. 10/10 pain at end of session returning to bed. Min guard for safety    Transfers Overall transfer level: Needs  assistance Equipment used:  (bed rail) Transfers: Sit to/from Stand Sit to Stand: Min guard;From elevated surface         General transfer comment: min guard for safety. Bed height elevated a bit. Increased time and effort 2/2 pain.    Balance Overall balance assessment: Needs assistance Sitting-balance support: Bilateral upper extremity supported;Feet supported Sitting balance-Leahy Scale: Fair Sitting balance - Comments: pt utilizing BUE for support/offloading 2/2 pain   Standing balance support: No upper extremity supported;During functional activity Standing balance-Leahy Scale: Fair Standing balance comment: able to stand with no UE support. seeks single extremity support as he fatigued and/or pain increases                           ADL either performed or assessed with clinical judgement   ADL Overall ADL's : Needs assistance/impaired Eating/Feeding: Set up;Sitting Eating/Feeding Details (indicate cue type and reason): supported sitting for pain management Grooming: Set up;Sitting Grooming Details (indicate cue type and reason): supported sitting for pain management Upper Body Bathing: Minimal assistance;Sitting;Set up Upper Body Bathing Details (indicate cue type and reason): supported sitting for pain management Lower Body Bathing: Minimal assistance;Sit to/from stand   Upper Body Dressing : Set up;Sitting   Lower Body Dressing: Minimal assistance;Sit to/from stand   Toilet Transfer: Min guard;Ambulation;BSC Toilet Transfer Details (indicate cue type and reason): min guard for safety. Increased pain with ambulation. Utilized rw a bit for pain management. Toileting- Clothing Manipulation and Hygiene: Minimal assistance;Sit to/from stand Toileting - Clothing Manipulation Details (indicate cue type and reason): discussed AE Tub/ Shower Transfer: Walk-in shower;Ambulation;3 in 1;Rolling walker   Functional mobility during ADLs: Min  guard;Rolling  walker General ADL Comments: Able to physically mobilize with no external support. However, pt with signifianctly increased pain mobilizing in the room. Utilized rw for a bit to off load and reduce pain.     Vision         Perception     Praxis      Pertinent Vitals/Pain Pain Assessment: 0-10 Pain Score: 6  Pain Location: low back Pain Descriptors / Indicators:  (stiffness) Pain Intervention(s): Limited activity within patient's tolerance     Hand Dominance     Extremity/Trunk Assessment Upper Extremity Assessment Upper Extremity Assessment: Overall WFL for tasks assessed   Lower Extremity Assessment Lower Extremity Assessment: Defer to PT evaluation   Cervical / Trunk Assessment Cervical / Trunk Assessment: Other exceptions Cervical / Trunk Exceptions: s/p spinal surgery   Communication Communication Communication: No difficulties   Cognition Arousal/Alertness: Awake/alert Behavior During Therapy: WFL for tasks assessed/performed Overall Cognitive Status: Within Functional Limits for tasks assessed                                     General Comments  VSS on RA    Exercises     Shoulder Instructions      Home Living Family/patient expects to be discharged to:: Private residence Living Arrangements: Spouse/significant other Available Help at Discharge: Family;Available 24 hours/day Type of Home: House Home Access: Stairs to enter [employer] of Steps: 4 Entrance Stairs-Rails: Right;Left Home Layout: One level     Bathroom Shower/Tub: Producer, television/film/video: Standard     Home Equipment: Cane - single point          Prior Functioning/Environment Level of Independence: Independent with assistive device(s)        Comments: pt ambulating with cane for community distances, independent in the home        OT Problem List: Impaired balance (sitting and/or standing);Decreased knowledge of use of DME or  AE;Decreased knowledge of precautions;Pain      OT Treatment/Interventions: Self-care/ADL training;Therapeutic exercise;DME and/or AE instruction;Therapeutic activities;Patient/family education;Balance training    OT Goals(Current goals can be found in the care plan section) Acute Rehab OT Goals Patient Stated Goal: to reduce pain and return to independent mobility OT Goal Formulation: With patient Time For Goal Achievement: 11/16/20 Potential to Achieve Goals: Good ADL Goals Pt Will Perform Lower Body Bathing: with modified independence;sit to/from stand Pt Will Perform Lower Body Dressing: with modified independence;sit to/from stand Pt Will Transfer to Toilet: with modified independence;ambulating Pt Will Perform Toileting - Clothing Manipulation and hygiene: with modified independence;sit to/from stand Pt Will Perform Tub/Shower Transfer: Shower transfer;with modified independence;ambulating  OT Frequency: Min 2X/week   Barriers to D/C:            Co-evaluation              AM-PAC OT "6 Clicks" Daily Activity     Outcome Measure Help from another person eating meals?: None Help from another person taking care of personal grooming?: None Help from another person toileting, which includes using toliet, bedpan, or urinal?: A Little Help from another person bathing (including washing, rinsing, drying)?: A Little Help from another person to put on and taking off regular upper body clothing?: None Help from another person to put on and taking off regular lower body clothing?: A Little 6 Click Score: 21   End of Session Equipment Utilized During Treatment:  Back brace;Rolling walker Nurse Communication:  (Pt gave oral pain meds at start of session)  Activity Tolerance: Patient limited by pain Patient left: in bed;with call bell/phone within reach;with family/visitor present  OT Visit Diagnosis: Unsteadiness on feet (R26.81);Pain                Time: 8250-5397 OT Time  Calculation (min): 25 min Charges:  OT General Charges $OT Visit: 1 Visit OT Evaluation $OT Eval Low Complexity: 1 Low OT Treatments $Self Care/Home Management : 8-22 mins  Raynald Kemp, OT Acute Rehabilitation Services Pager: [phone] Office: [phone]   Pilar Grammes 11/02/2020, 12:45 PM

## 2020-11-02 NOTE — Evaluation (Signed)
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 No other significant PMH noted. Pt underwent L4-S1 TLIF on 11/01/2020.  Clinical Impression  Pt presents to PT with deficits in gait, balance, endurance, power, balance, and with low back pain. Pt is able to perform all mobility this session with limited assistance from PT, mainly providing verbal cues for back precautions. Pt does demonstrate gait deviations as noted below, but is able to ambulate at a supervision level currently. Pt will benefit from continued acute PT POC to progress gait training with use of cane and to initiate stair training. PT recommends no PT or DME needs at the time of discharge.    Follow Up Recommendations No PT follow up;Supervision - Intermittent    Equipment Recommendations  None recommended by PT (pt already owns cane)    Recommendations for Other Services       Precautions / Restrictions Precautions Precautions: Fall;Back Precaution Booklet Issued: No Precaution Comments: PT verbally reviewed back precautions with patient Required Braces or Orthoses: Spinal Brace Spinal Brace: Lumbar corset;Applied in sitting position Restrictions Weight Bearing Restrictions: No      Mobility  Bed Mobility Overal bed mobility: Needs Assistance Bed Mobility: Rolling;Sidelying to Sit Rolling: Supervision Sidelying to sit: Supervision       General bed mobility comments: verbal cues for log roll technique    Transfers Overall transfer level: Needs assistance Equipment used:  (bed rail) Transfers: Sit to/from Stand Sit to Stand: Supervision         General transfer comment: pt with increased time, pushing from bed rail  Ambulation/Gait Ambulation/Gait assistance: Supervision Gait  Distance (Feet): 150 Feet Assistive device: None Gait Pattern/deviations: Step-through pattern;Decreased stance time - left Gait velocity: reduced Gait velocity interpretation: <1.8 ft/sec, indicate of risk for recurrent falls General Gait Details: pt with slowed step-through gait, reduced weight shift to left and reduced stance time on LLE  Stairs            Wheelchair Mobility    Modified Rankin (Stroke Patients Only)       Balance Overall balance assessment: Needs assistance Sitting-balance support: No upper extremity supported;Feet supported Sitting balance-Leahy Scale: Good     Standing balance support: No upper extremity supported;During functional activity Standing balance-Leahy Scale: Fair                               Pertinent Vitals/Pain Pain Assessment: 0-10 Pain Score: 5  Pain Location: low back Pain Descriptors / Indicators:  (stiffness) Pain Intervention(s): Limited activity within patient's tolerance    Home Living Family/patient expects to be discharged to:: Private residence Living Arrangements: Spouse/significant other Available Help at Discharge: Family;Available 24 hours/day Type of Home: House Home Access: Stairs to enter Entrance Stairs-Rails: Right;Left (wide) Entrance Stairs-Number of Steps: 4 Home Layout: One level Home Equipment: Cane - single point      Prior Function Level of Independence: Independent with assistive device(s)         Comments: pt ambulating with cane for community distances, independent in the home     Hand Dominance        Extremity/Trunk Assessment   Upper Extremity Assessment Upper Extremity Assessment: Overall WFL for tasks assessed    Lower Extremity Assessment Lower Extremity Assessment: Overall  WFL for tasks assessed    Cervical / Trunk Assessment Cervical / Trunk Assessment: Other exceptions Cervical / Trunk Exceptions: s/p spinal surgery  Communication   Communication: No  difficulties  Cognition Arousal/Alertness: Awake/alert Behavior During Therapy: WFL for tasks assessed/performed Overall Cognitive Status: Within Functional Limits for tasks assessed                                        General Comments General comments (skin integrity, edema, etc.): VSS on RA    Exercises     Assessment/Plan    PT Assessment Patient needs continued PT services  PT Problem List Decreased strength;Decreased activity tolerance;Decreased balance;Decreased mobility;Decreased knowledge of precautions;Pain       PT Treatment Interventions DME instruction;Gait training;Stair training;Functional mobility training;Therapeutic activities;Therapeutic exercise;Balance training;Neuromuscular re-education;Patient/family education    PT Goals (Current goals can be found in the Care Plan section)  Acute Rehab PT Goals Patient Stated Goal: to reduce pain and return to independent mobility PT Goal Formulation: With patient Time For Goal Achievement: 11/16/20 Potential to Achieve Goals: Good    Frequency Min 5X/week   Barriers to discharge        Co-evaluation               AM-PAC PT "6 Clicks" Mobility  Outcome Measure Help needed turning from your back to your side while in a flat bed without using bedrails?: A Little Help needed moving from lying on your back to sitting on the side of a flat bed without using bedrails?: A Little Help needed moving to and from a bed to a chair (including a wheelchair)?: A Little Help needed standing up from a chair using your arms (e.g., wheelchair or bedside chair)?: A Little Help needed to walk in hospital room?: A Little Help needed climbing 3-5 steps with a railing? : A Little 6 Click Score: 18    End of Session Equipment Utilized During Treatment: Back brace Activity Tolerance: Patient tolerated treatment well Patient left: in chair;with call bell/phone within reach Nurse Communication: Mobility  status PT Visit Diagnosis: Unsteadiness on feet (R26.81);Other abnormalities of gait and mobility (R26.89);Pain Pain - part of body:  (low back)    Time: 1601-0932 PT Time Calculation (min) (ACUTE ONLY): 20 min   Charges:   PT Evaluation $PT Eval Low Complexity: 1 Low          Arlyss Gandy, PT, DPT Acute Rehabilitation Pager: [phone]   Jamie Baldwin 11/02/2020, 9:26 AM

## 2020-11-02 NOTE — Progress Notes (Signed)
Indwelling foley removed per pt request

## 2020-11-03 MED ORDER — PANTOPRAZOLE SODIUM 40 MG PO TBEC: 40.0000 mg | DELAYED_RELEASE_TABLET | Freq: Every day | ORAL | Status: DC

## 2020-11-03 MED ORDER — HYDROCODONE-ACETAMINOPHEN 7.5-325 MG PO TABS: 1.0000 | ORAL_TABLET | ORAL | 0 refills | Status: DC | PRN

## 2020-11-03 MED ORDER — DOCUSATE SODIUM 100 MG PO CAPS: 100.0000 mg | ORAL_CAPSULE | Freq: Two times a day (BID) | ORAL | 0 refills | Status: DC

## 2020-11-03 MED ORDER — METHOCARBAMOL 500 MG PO TABS: 500.0000 mg | ORAL_TABLET | Freq: Three times a day (TID) | ORAL | 1 refills | Status: DC | PRN

## 2020-11-03 MED FILL — Sodium Chloride Irrigation Soln 0.9%: Qty: 3000 | Status: AC

## 2020-11-03 MED FILL — Heparin Sodium (Porcine) Inj 1000 Unit/ML: INTRAMUSCULAR | Qty: 30 | Status: AC

## 2020-11-03 MED FILL — Sodium Chloride IV Soln 0.9%: INTRAVENOUS | Qty: 1000 | Status: AC

## 2020-11-03 NOTE — Progress Notes (Signed)
Patient discharged with transport provided by family.  AVS education was provided to the patient by Primary RN regarding follow appointment and changes to the medication regimen with verbalizing understand and no questions or concerns voiced by the patient at this time.  PIV's removed prior to discharge with patient escorted off the floor by primary RN and volunteer.

## 2020-11-03 NOTE — Progress Notes (Signed)
     Subjective: 2 Days Post-Op Procedure(s) (LRB): LEFT L4-5 AND L5-S1 LEFT TRANSFORAMINAL LUMBAR INTERBODY FUSION WITH PEDICLE SCREWS, RODS AND CAGES, LOCAL AND ALLOGRAFT BONE GRAFT, VIVIGEN (Left) Awake, alert and oriented x 4. Did stairs today and walking in the hallway, Voidiing without difficulty, Passing flatus no BM yet.  Patient reports pain as moderate.    Objective:   VITALS:  Temp:  [97.6 F (36.4 C)-98.4 F (36.9 C)] 98.4 F (36.9 C) (02/24 0802) Pulse Rate:  [72-89] 72 (02/24 0802) Resp:  [14-18] 18 (02/24 0802) BP: (119-128)/(61-86) 119/86 (02/24 0802) SpO2:  [95 %-96 %] 96 % (02/24 0802)  Neurologically intact ABD soft Neurovascular intact Sensation intact distally Intact pulses distally Dorsiflexion/Plantar flexion intact Incision: dressing C/D/I, no drainage and scant drainage No cellulitis present   LABS Recent Labs    11/02/20 0251  HGB 12.8*  WBC 18.5*  PLT 247   Recent Labs    11/02/20 0251  NA 136  K 4.0  CL 102  CO2 22  BUN 12  CREATININE 0.84  GLUCOSE 163*   No results for input(s): LABPT, INR in the last 72 hours.   Assessment/Plan: 2 Days Post-Op Procedure(s) (LRB): LEFT L4-5 AND L5-S1 LEFT TRANSFORAMINAL LUMBAR INTERBODY FUSION WITH PEDICLE SCREWS, RODS AND CAGES, LOCAL AND ALLOGRAFT BONE GRAFT, VIVIGEN (Left)  Up with therapy Discharge home with home health  Vira Browns 11/03/2020, 2:08 PMPatient ID: Jamie Baldwin, male   DOB: [birth year], 36 y.o.   MRN: [id number]

## 2020-11-03 NOTE — Progress Notes (Signed)
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 No other significant PMH noted. Pt underwent L4-S1 TLIF on 11/01/2020.    PT Comments    Pt tolerates treatment well, ambulating for increased distances and negotiating stairs. Pt does report increased difficulty with mobility yesterday afternoon, requiring use of RW to improve stability. Pt will benefit from continued acute PT POC to improve activity tolerance and aide in restoring independence. PT recommends discharge home with a RW, no current PT follow-up needs at this time.   Follow Up Recommendations  No PT follow up;Supervision - Intermittent     Equipment Recommendations  Rolling walker with 5" wheels    Recommendations for Other Services       Precautions / Restrictions Precautions Precautions: Fall;Back Precaution Booklet Issued: Yes (comment) Precaution Comments: PT verbally reviewed back precautions with patient, handout in room Required Braces or Orthoses: Spinal Brace Spinal Brace:  (on upon arrival, PT aides in adjustment of walker) Restrictions Weight Bearing Restrictions: No    Mobility  Bed Mobility               General bed mobility comments: not assessed    Transfers Overall transfer level: Needs assistance Equipment used: None Transfers: Sit to/from Stand Sit to Stand: Supervision            Ambulation/Gait Ambulation/Gait assistance: Supervision Gait Distance (Feet): 200 Feet Assistive device: Straight cane Gait Pattern/deviations: Step-through pattern;Decreased weight shift to left Gait velocity: reduced Gait velocity interpretation: <1.8 ft/sec, indicate of risk for recurrent falls General Gait Details: pt with slowed step-through gait, increased R lateral trunk  lean   Stairs Stairs: Yes Stairs assistance: Supervision Stair Management: One rail Left;Alternating pattern Number of Stairs: 4     Wheelchair Mobility    Modified Rankin (Stroke Patients Only)       Balance Overall balance assessment: Needs assistance Sitting-balance support: No upper extremity supported;Feet supported       Standing balance support: During functional activity;Single extremity supported Standing balance-Leahy Scale: Good                              Cognition Arousal/Alertness: Awake/alert Behavior During Therapy: WFL for tasks assessed/performed Overall Cognitive Status: Within Functional Limits for tasks assessed                                        Exercises      General Comments General comments (skin integrity, edema, etc.): VSS on RA      Pertinent Vitals/Pain Pain Assessment: 0-10 Pain Score: 5  Pain Location: low back Pain Descriptors / Indicators: Aching Pain Intervention(s): Monitored during session    Home Living                      Prior Function            PT Goals (current goals can now be found in the care plan section) Acute Rehab PT Goals Patient Stated Goal: to reduce pain and return to independent mobility Progress towards PT goals: Progressing toward goals    Frequency    Min 5X/week      PT Plan Current  plan remains appropriate    Co-evaluation              AM-PAC PT "6 Clicks" Mobility   Outcome Measure  Help needed turning from your back to your side while in a flat bed without using bedrails?: A Little Help needed moving from lying on your back to sitting on the side of a flat bed without using bedrails?: A Little Help needed moving to and from a bed to a chair (including a wheelchair)?: A Little Help needed standing up from a chair using your arms (e.g., wheelchair or bedside chair)?: A Little Help needed to walk in hospital room?: A Little Help  needed climbing 3-5 steps with a railing? : A Little 6 Click Score: 18    End of Session Equipment Utilized During Treatment: Back brace Activity Tolerance: Patient tolerated treatment well Patient left: in chair;with call bell/phone within reach Nurse Communication: Mobility status PT Visit Diagnosis: Unsteadiness on feet (R26.81);Other abnormalities of gait and mobility (R26.89);Pain Pain - part of body:  (back)     Time: 1219-7588 PT Time Calculation (min) (ACUTE ONLY): 14 min  Charges:  $Gait Training: 8-22 mins                     Arlyss Gandy, PT, DPT Acute Rehabilitation Pager: [phone]    KAMONI GENTLES 11/03/2020, 9:35 AM

## 2020-11-03 NOTE — Discharge Instructions (Signed)

## 2020-11-03 NOTE — Progress Notes (Signed)
PHARMACIST - PHYSICIAN COMMUNICATION DR:   Otelia Sergeant CONCERNING: Protonix IV to Oral Route Change Policy  RECOMMENDATION: This patient is receiving Protonix by the intravenous route.  Based on criteria approved by the Pharmacy and Therapeutics Committee, this drug is being converted to the equivalent oral dose form(s).  DESCRIPTION: These criteria include:  The patient is eating (either orally or via tube) and/or has been taking other orally administered medications for a least 24 hours  There is no active GI bleed or impaired GI absorption noted.   If you have questions about this conversion, please contact the Pharmacy Department  []   [phone] )  ( [phone] [x]   [phone] )   []   [phone] )  [facility] []   [phone] )  [facility]    [facility], PharmD, BCPS Pager: [phone] 1:44 PM

## 2020-11-03 NOTE — TOC Transition Note (Addendum)
Transition of Care [facility]) - CM/SW Discharge Note   Patient Details  Name: Jamie Baldwin MRN: [id number] Date of Birth: 1986-03-08  [facility] CM/SW Contact:  Epifanio Lesches, RN Phone Number: 11/03/2020, 3:33 PM   Clinical Narrative:    Patient will DC EK:[facility]KL Anticipated DC date: 11/03/2020 Transport by: car     -s/p LEFT L4-5 AND L5-S1 LEFT TRANSFORAMINAL LUMBAR       INTERBODY FUSION, 2/22 From home with spouse , Jamie Baldwin. Per MD patient ready for DC today. RN, patient, and patient's family, and facility notified of DC. Pt with HHPT order noted. Per PT's recommendation : No PT follow up;Supervision - Intermittent  Pt d/c prior to securing  HHPT services. Referral made with [facility] , [facility] , [facility], [facility], [facility], all declined. Pt made aware. Pt stated he will be fine, ok without receiving HH. States will have supervision and assistance need @ home.   Pt without Rx med concerns.  Post hospital f/u scheduled and noted on AVS.  Jamie Baldwin (Spouse)  Pt's cell #   [phone]  [phone]     Final next level of care: Home/Self Care Barriers to Discharge: No Barriers Identified   Patient Goals and CMS Choice        Discharge Placement                       Discharge Plan and Services  Social Determinants of Health (SDOH) Interventions     Readmission Risk Interventions No flowsheet data found.

## 2020-11-05 ENCOUNTER — Encounter: Payer: Self-pay | Admitting: Specialist

## 2020-11-07 ENCOUNTER — Other Ambulatory Visit: Payer: Self-pay | Admitting: Specialist

## 2020-11-07 MED ORDER — ONDANSETRON HCL 4 MG PO TABS: 4.0000 mg | ORAL_TABLET | Freq: Three times a day (TID) | ORAL | 0 refills | Status: DC | PRN

## 2020-11-07 MED ORDER — DIPHENHYDRAMINE HCL 50 MG PO TABS: 50.0000 mg | ORAL_TABLET | Freq: Every evening | ORAL | 0 refills | Status: DC | PRN

## 2020-11-08 DIAGNOSIS — M5136 Other intervertebral disc degeneration, lumbar region: Secondary | ICD-10-CM | POA: Diagnosis present

## 2020-11-14 NOTE — Discharge Summary (Signed)
Patient ID: Jamie Baldwin MRN: [id number] DOB/AGE: Mar 23, 1986 34 y.o.  Admit date: 11/01/2020 Discharge date: 11/03/2020 Admission Diagnoses:  Principal Problem:   Degenerative disc disease, lumbar Active Problems:   Status post lumbar spinal fusion   Discharge Diagnoses:  Principal Problem:   Degenerative disc disease, lumbar Active Problems:   Status post lumbar spinal fusion  status post Procedure(s): LEFT L4-5 AND L5-S1 LEFT TRANSFORAMINAL LUMBAR INTERBODY FUSION WITH PEDICLE SCREWS, RODS AND CAGES, LOCAL AND ALLOGRAFT BONE GRAFT, VIVIGEN  Past Medical History:  Diagnosis Date  . Allergic rhinitis due to pollen   . Lumbar stenosis 2021    Surgeries: Procedure(s): LEFT L4-5 AND L5-S1 LEFT TRANSFORAMINAL LUMBAR INTERBODY FUSION WITH PEDICLE SCREWS, RODS AND CAGES, LOCAL AND ALLOGRAFT BONE GRAFT, VIVIGEN on 11/01/2020   Consultants:   Discharged Condition: Improved  Hospital Course: Jamie Baldwin is an 35 y.o. male who was admitted 11/01/2020 for operative treatment of Degenerative disc disease, lumbar. Patient failed conservative treatments (please see the history and physical for the specifics) and had severe unremitting pain that affects sleep, daily activities and work/hobbies. After pre-op clearance, the patient was taken to the operating room on 11/01/2020 and underwent  Procedure(s): LEFT L4-5 AND L5-S1 LEFT TRANSFORAMINAL LUMBAR INTERBODY FUSION WITH PEDICLE SCREWS, RODS AND CAGES, LOCAL AND ALLOGRAFT BONE GRAFT, VIVIGEN.    Patient was given perioperative antibiotics:  Anti-infectives (From admission, onward)   Start     Dose/Rate Route Frequency Ordered Stop   11/01/20 1830  ceFAZolin (ANCEF) IVPB 2g/100 mL premix        2 g 200 mL/hr over 30 Minutes Intravenous Every 8 hours 11/01/20 1818 11/02/20 1029   11/01/20 0614  ceFAZolin (ANCEF) 2-4 GM/100ML-% IVPB       Note to Pharmacy: Larose Hires, Lindsi   : cabinet override      11/01/20 0614  11/01/20 0827   11/01/20 0600  ceFAZolin (ANCEF) IVPB 2g/100 mL premix        2 g 200 mL/hr over 30 Minutes Intravenous On call to O.R. 11/01/20 0559 11/01/20 1214       Patient was given sequential compression devices and early ambulation to prevent DVT.   Patient benefited maximally from hospital stay and there were no complications. At the time of discharge, the patient was urinating/moving their bowels without difficulty, tolerating a regular diet, pain is controlled with oral pain medications and they have been cleared by PT/OT.   Recent vital signs: No data found.   Recent laboratory studies: No results for input(s): WBC, HGB, HCT, PLT, NA, K, CL, CO2, BUN, CREATININE, GLUCOSE, INR, CALCIUM in the last 72 hours.  Invalid input(s): PT, 2   Discharge Medications:   Allergies as of 11/03/2020   No Known Allergies     Medication List    STOP taking these medications   ibuprofen 200 MG tablet Commonly known as: ADVIL   methylPREDNISolone 4 MG Tbpk tablet Commonly known as: MEDROL DOSEPAK   traMADol 50 MG tablet Commonly known as: ULTRAM     TAKE these medications   docusate sodium 100 MG capsule Commonly known as: COLACE Take 1 capsule (100 mg total) by mouth 2 (two) times daily.   gabapentin 300 MG capsule Commonly known as: NEURONTIN Take 1 capsule (300 mg total) by mouth at bedtime for 3 days, THEN 1 capsule (300 mg total) 2 (two) times daily for 3 days, THEN 1 capsule (300 mg total) 3 (three) times daily for 3 days. Start  taking on: June 16, 2020   HYDROcodone-acetaminophen 7.5-325 MG tablet Commonly known as: NORCO Take 1 tablet by mouth every 4 (four) hours as needed for moderate pain ((score 4 to 6)).   methocarbamol 500 MG tablet Commonly known as: ROBAXIN Take 1 tablet (500 mg total) by mouth every 8 (eight) hours as needed for muscle spasms.       Diagnostic Studies: DG Lumbar Spine 2-3 Views  Result Date: 11/01/2020 CLINICAL DATA:  Surgery,  elective. Additional history provided: Left L4-5 and L5-S1 transforaminal lumbar interbody fusion with pedicle screws, rods and cages, local and allograft bone graft, vivigen. Provided fluoroscopy time 1 minutes, 22 seconds (42.78 mGy). EXAM: LUMBAR SPINE - 2-3 VIEW; DG C-ARM 1-60 MIN COMPARISON:  Lumbar spine radiographs 06/16/2020. FINDINGS: AP, lateral and oblique intraoperative fluoroscopic images of the lumbosacral spine are submitted, 4 images total. A posterior spinal fusion construct spans the L4-S1 levels (bilateral pedicle screws and vertical interconnecting rods). Interbody devices are also present at L4-L5 and L5-S1. No unexpected finding. IMPRESSION: Two intraoperative fluoroscopic images of the lumbosacral spine from L4-S1 fusion, as described. Electronically Signed   By: Jackey Loge DO   On: 11/01/2020 14:21   DG C-Arm 1-60 Min  Result Date: 11/01/2020 CLINICAL DATA:  Surgery, elective. Additional history provided: Left L4-5 and L5-S1 transforaminal lumbar interbody fusion with pedicle screws, rods and cages, local and allograft bone graft, vivigen. Provided fluoroscopy time 1 minutes, 22 seconds (42.78 mGy). EXAM: LUMBAR SPINE - 2-3 VIEW; DG C-ARM 1-60 MIN COMPARISON:  Lumbar spine radiographs 06/16/2020. FINDINGS: AP, lateral and oblique intraoperative fluoroscopic images of the lumbosacral spine are submitted, 4 images total. A posterior spinal fusion construct spans the L4-S1 levels (bilateral pedicle screws and vertical interconnecting rods). Interbody devices are also present at L4-L5 and L5-S1. No unexpected finding. IMPRESSION: Two intraoperative fluoroscopic images of the lumbosacral spine from L4-S1 fusion, as described. Electronically Signed   By: Jackey Loge DO   On: 11/01/2020 14:21    Discharge Instructions    Call MD / Call 911   Complete by: As directed    If you experience chest pain or shortness of breath, CALL 911 and be transported to the hospital emergency room.  If  you develope a fever above 101 F, pus (white drainage) or increased drainage or redness at the wound, or calf pain, call your surgeon's office.   Constipation Prevention   Complete by: As directed    Drink plenty of fluids.  Prune juice may be helpful.  You may use a stool softener, such as Colace (over the counter) 100 mg twice a day.  Use MiraLax (over the counter) for constipation as needed.   Diet - low sodium heart healthy   Complete by: As directed    Discharge instructions   Complete by: As directed    Call if there is increasing drainage, fever greater than 101.5, severe head aches, and worsening nausea or light sensitivity. If shortness of breath, bloody cough or chest tightness or pain go to an emergency room. No lifting greater than 10 lbs. Avoid bending, stooping and twisting. Use brace when sitting and out of bed even to go to bathroom. Walk in house for first 2 weeks then may start to get out slowly increasing distances up to one mile by 4-6 weeks post op. After 5 days may shower and change dressing following bathing with shower.When bathing remove the brace shower and replace brace before getting out of the shower. If drainage,  keep dry dressing and do not bathe the incision, use an moisture impervious dressing. Please call and return for scheduled follow up appointment 2 weeks from the time of surgery.   Driving restrictions   Complete by: As directed    No driving for 3 weeks   Increase activity slowly as tolerated   Complete by: As directed    Lifting restrictions   Complete by: As directed    No lifting for 12 weeks       Follow-up Information    Kerrin Champagne, MD In 2 weeks.   Specialty: Orthopedic Surgery Why: For wound re-check Contact information: [address] [place] [address] [phone]               Discharge Plan:  discharge to home  Disposition:     Signed: Zonia Kief 11/14/2020, 2:52 PM

## 2020-11-17 ENCOUNTER — Encounter: Payer: Self-pay | Admitting: Surgery

## 2020-11-17 ENCOUNTER — Ambulatory Visit (INDEPENDENT_AMBULATORY_CARE_PROVIDER_SITE_OTHER): Payer: 59 | Admitting: Surgery

## 2020-11-17 ENCOUNTER — Ambulatory Visit: Payer: Self-pay

## 2020-11-17 VITALS — BP 143/89 | HR 79

## 2020-11-17 DIAGNOSIS — M545 Low back pain, unspecified: Secondary | ICD-10-CM

## 2020-11-17 DIAGNOSIS — Z981 Arthrodesis status: Secondary | ICD-10-CM

## 2020-11-17 NOTE — Progress Notes (Signed)
   Post-Op Visit Note   Patient: Jamie Baldwin           Date of Birth: [birth year]           MRN: [id number] Visit Date: 11/17/2020 PCP: Angela Cox, MD   Assessment & Plan:  Chief Complaint:  Chief Complaint  Patient presents with  . Lower Back - Routine Post Op  35 year old white male who is 2 weeks status post L4-S1 fusion returns.  States that he is doing well.  Not taking any narcotics.  Occasionally takes a Robaxin at night for spasms.  Has been compliant with wearing his brace.  States that he has been doing some work from home but is getting up and doing fairly frequent position changes.  He also states that he has done some walking of his dog.    Visit Diagnoses:  1. Low back pain, unspecified back pain laterality, unspecified chronicity, unspecified whether sciatica present   2. S/P lumbar fusion     Plan: Advised patient that I am very pleased with his progress at this point.  He knows to continue wearing his brace at least until 12 weeks postop.  I did recommend that he hold off on walking his dog due to the increased risk of falls/stumbles.  Will gradually increase walking distances over the next several weeks.  Follow-up with Dr. Otelia Sergeant in 4 weeks for recheck.  Return sooner if needed.  Follow-Up Instructions: Return in about 4 weeks (around 12/15/2020) for with dr Otelia Sergeant for recheck postop lumbar fusion.   Orders:  Orders Placed This Encounter  Procedures  . XR Lumbar Spine 2-3 Views   No orders of the defined types were placed in this encounter.   Imaging: No results found.  PMFS History: Patient Active Problem List   Diagnosis Date Noted  . Degenerative disc disease, lumbar 11/08/2020    Class: Chronic  . Status post lumbar spinal fusion 11/01/2020  . Preventative health care 08/19/2019  . Allergic rhinitis due to pollen    Past Medical History:  Diagnosis Date  . Allergic rhinitis due to pollen   . Lumbar stenosis 2021    Family  History  Problem Relation Age of Onset  . Diabetes Neg Hx   . Cancer Neg Hx   . Heart disease Neg Hx     Past Surgical History:  Procedure Laterality Date  . WISDOM TOOTH EXTRACTION     Social History   Occupational History  . 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 Smoking status: Former Smoker    Quit date: 2019    Years since quitting: 3.1  . Smokeless tobacco: Former Neurosurgeon    Quit date: 2008  Advertising account planner  . Vaping Use: Some days  Substance and Sexual Activity  . Alcohol use: Yes    Comment: very occasional  . Drug use: Never  . Sexual activity: Yes   Exam Very pleasant white male alert and oriented in no acute distress.  Wound looks good.  Staples removed and Steri-Strips applied.  No drainage or signs of infection.  Neurologically intact.

## 2020-12-15 ENCOUNTER — Encounter: Payer: Self-pay | Admitting: Specialist

## 2020-12-15 ENCOUNTER — Ambulatory Visit: Payer: Self-pay

## 2020-12-15 ENCOUNTER — Ambulatory Visit (INDEPENDENT_AMBULATORY_CARE_PROVIDER_SITE_OTHER): Payer: 59 | Admitting: Specialist

## 2020-12-15 ENCOUNTER — Other Ambulatory Visit: Payer: Self-pay

## 2020-12-15 VITALS — BP 118/86 | HR 79 | Ht 71.0 in | Wt 217.0 lb

## 2020-12-15 DIAGNOSIS — Z981 Arthrodesis status: Secondary | ICD-10-CM

## 2020-12-15 NOTE — Patient Instructions (Signed)
Avoid frequent bending and stooping  No lifting greater than 10-15 lbs. May use ice or moist heat for pain. Weight loss is of benefit.. Exercise is important to improve your indurance and does allow people to function better inspite of back pain. PT for building strength hip flexors and left knee extension.

## 2020-12-15 NOTE — Progress Notes (Signed)
   Post-Op Visit Note   Patient: Jamie Baldwin           Date of Birth: 1986-07-22           MRN: [id number] Visit Date: 12/15/2020 PCP: Angela Cox, MD   Assessment & Plan:6 week post op 2 level TIF  Chief Complaint:  Chief Complaint  Patient presents with  . Lower Back - Routine Post Op    6 weeks, 2 days post Left L4-5, L5-S1 TLIF   Visit Diagnoses:  1. S/P lumbar fusion     Plan: Avoid frequent bending and stooping  No lifting greater than 10-15 lbs. May use ice or moist heat for pain. Weight loss is of benefit.. Exercise is important to improve your indurance and does allow people to function better inspite of back pain. PT for building strength hip flexors and left knee extension.    Follow-Up Instructions: No follow-ups on file.   Orders:  Orders Placed This Encounter  Procedures  . XR Lumbar Spine 2-3 Views  Motor with left hip and left knee weakness 5-/5. SLR with hamstring tightness. Incision is healed. Heel and toe walking intact  Radiographs with hardware intact no abnormalities.  No orders of the defined types were placed in this encounter.   Imaging: No results found.  PMFS History: Patient Active Problem List   Diagnosis Date Noted  . Degenerative disc disease, lumbar 11/08/2020    Priority: High    Class: Chronic  . Status post lumbar spinal fusion 11/01/2020  . Preventative health care 08/19/2019  . Allergic rhinitis due to pollen    Past Medical History:  Diagnosis Date  . Allergic rhinitis due to pollen   . Lumbar stenosis 2021    Family History  Problem Relation Age of Onset  . Diabetes Neg Hx   . Cancer Neg Hx   . Heart disease Neg Hx     Past Surgical History:  Procedure Laterality Date  . WISDOM TOOTH EXTRACTION     Social History   Occupational History  . 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 Smoking status: Former Smoker    Quit date: 2019    Years since quitting: 3.2  . Smokeless  tobacco: Former Neurosurgeon    Quit date: 2008  Advertising account planner  . Vaping Use: Some days  Substance and Sexual Activity  . Alcohol use: Yes    Comment: very occasional  . Drug use: Never  . Sexual activity: Yes

## 2021-01-26 ENCOUNTER — Other Ambulatory Visit: Payer: Self-pay

## 2021-01-26 ENCOUNTER — Encounter: Payer: Self-pay | Admitting: Specialist

## 2021-01-26 ENCOUNTER — Ambulatory Visit (INDEPENDENT_AMBULATORY_CARE_PROVIDER_SITE_OTHER): Payer: 59 | Admitting: Specialist

## 2021-01-26 ENCOUNTER — Ambulatory Visit: Payer: Self-pay

## 2021-01-26 VITALS — BP 138/90 | HR 75 | Ht 71.0 in | Wt 217.0 lb

## 2021-01-26 DIAGNOSIS — Z981 Arthrodesis status: Secondary | ICD-10-CM | POA: Diagnosis not present

## 2021-01-26 NOTE — Progress Notes (Signed)
   Post-Op Visit Note   Patient: Jamie Baldwin           Date of Birth: 05-Jun-1986           MRN: [id number] Visit Date: 01/26/2021 PCP: Angela Cox, MD   Assessment & Plan:  Chief Complaint:  Chief Complaint  Patient presents with  . Lower Back - Routine Post Op   Visit Diagnoses:  1. S/P lumbar fusion     Plan: Avoid frequent bending and stooping  No lifting greater than 25-35 lbs. May use ice or moist heat for pain. Weight loss is of benefit. Best medication for lumbar disc disease is arthritis medications like motrin. Exercise is important to improve your indurance and does allow people to function better inspite of back pain.  Follow-Up Instructions: No follow-ups on file.   Orders:  Orders Placed This Encounter  Procedures  . XR Lumbar Spine 2-3 Views  Lumbar incision is healed Motor is normal No sensory abnormality No Bowel or bladder difficulty. No orders of the defined types were placed in this encounter. Radiographs.   Imaging: No results found.  PMFS History: Patient Active Problem List   Diagnosis Date Noted  . Degenerative disc disease, lumbar 11/08/2020    Priority: High    Class: Chronic  . Status post lumbar spinal fusion 11/01/2020  . Preventative health care 08/19/2019  . Allergic rhinitis due to pollen    Past Medical History:  Diagnosis Date  . Allergic rhinitis due to pollen   . Lumbar stenosis 2021    Family History  Problem Relation Age of Onset  . Diabetes Neg Hx   . Cancer Neg Hx   . Heart disease Neg Hx     Past Surgical History:  Procedure Laterality Date  . WISDOM TOOTH EXTRACTION     Social History   Occupational History  . 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 Smoking status: Former Smoker    Quit date: 2019    Years since quitting: 3.3  . Smokeless tobacco: Former Neurosurgeon    Quit date: 2008  Advertising account planner  . Vaping Use: Some days  Substance and Sexual Activity  . Alcohol use: Yes     Comment: very occasional  . Drug use: Never  . Sexual activity: Yes

## 2021-01-26 NOTE — Patient Instructions (Signed)
Avoid frequent bending and stooping  No lifting greater than 25-35 lbs. May use ice or moist heat for pain. Weight loss is of benefit. Best medication for lumbar disc disease is arthritis medications like motrin. Exercise is important to improve your indurance and does allow people to function better inspite of back pain.

## 2021-04-13 IMAGING — RF DG LUMBAR SPINE 2-3V
1 series · 4 of 4 positions shown · non-contrast
Comparison: Lumbar spine radiographs 06/16/2020.

CLINICAL DATA: Surgery, elective. Additional history provided: Left
L4-5 and L5-S1 transforaminal lumbar interbody fusion with pedicle
screws, rods and cages, local and allograft bone graft, vivigen.
Provided fluoroscopy time 1 minutes, 22 seconds (42.78 mGy).

EXAM:
LUMBAR SPINE - 2-3 VIEW; DG C-ARM 1-60 MIN

[Series 1: run · 4 of 4 slices shown]
[im 1/4]
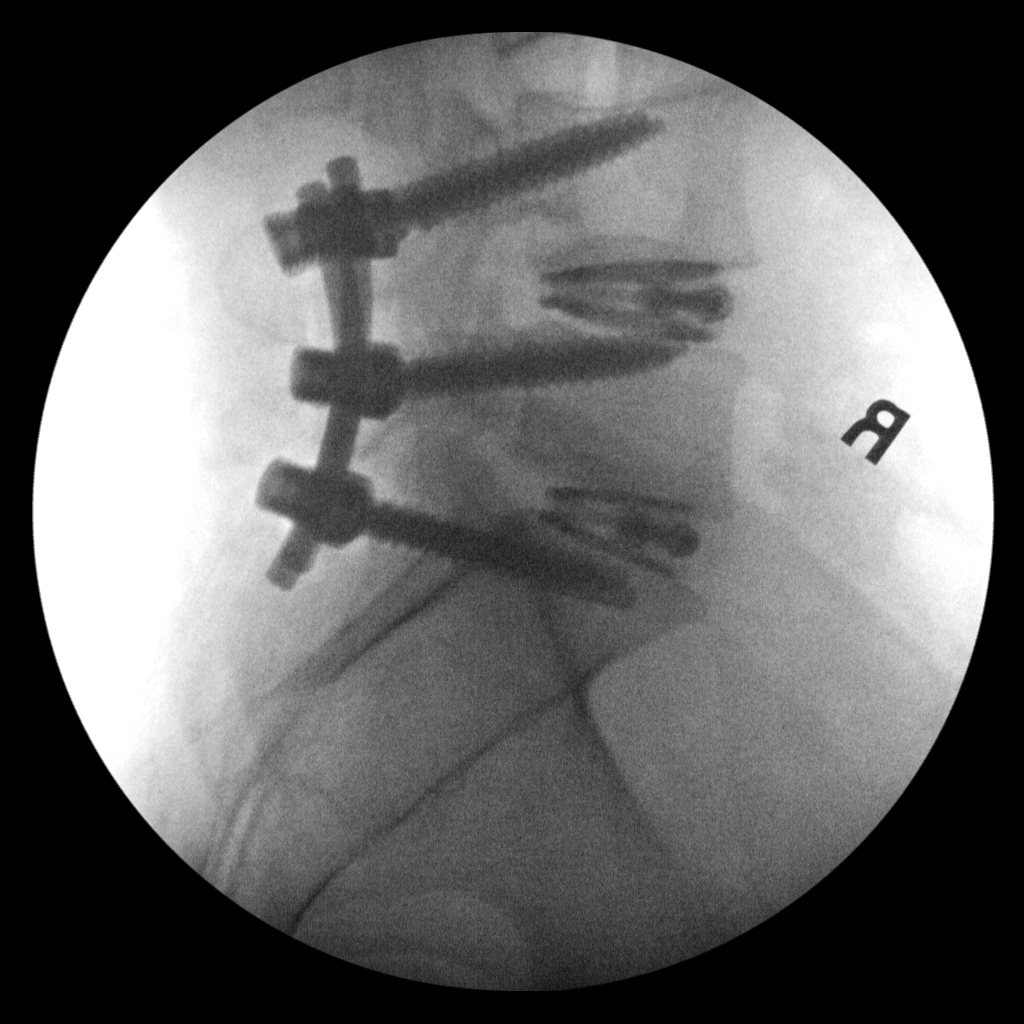
[im 2/4]
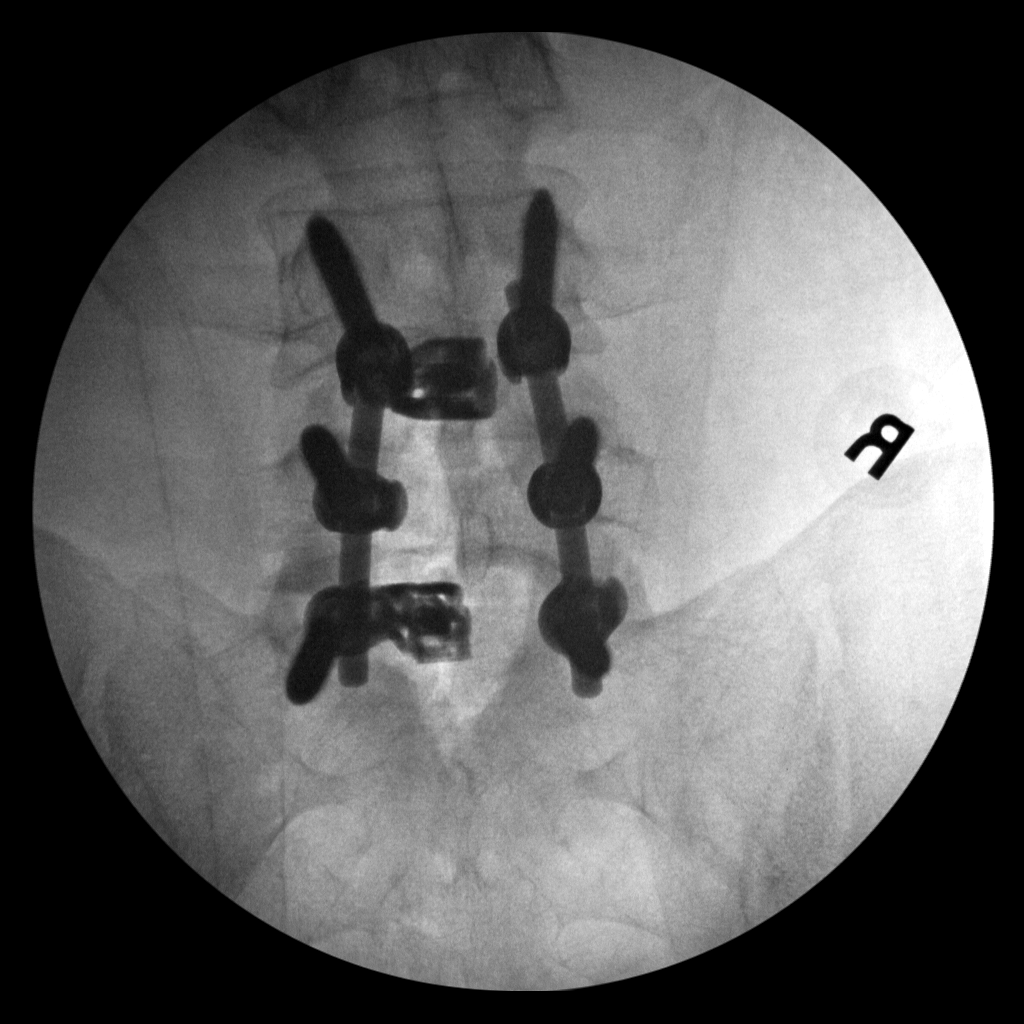
[im 3/4]
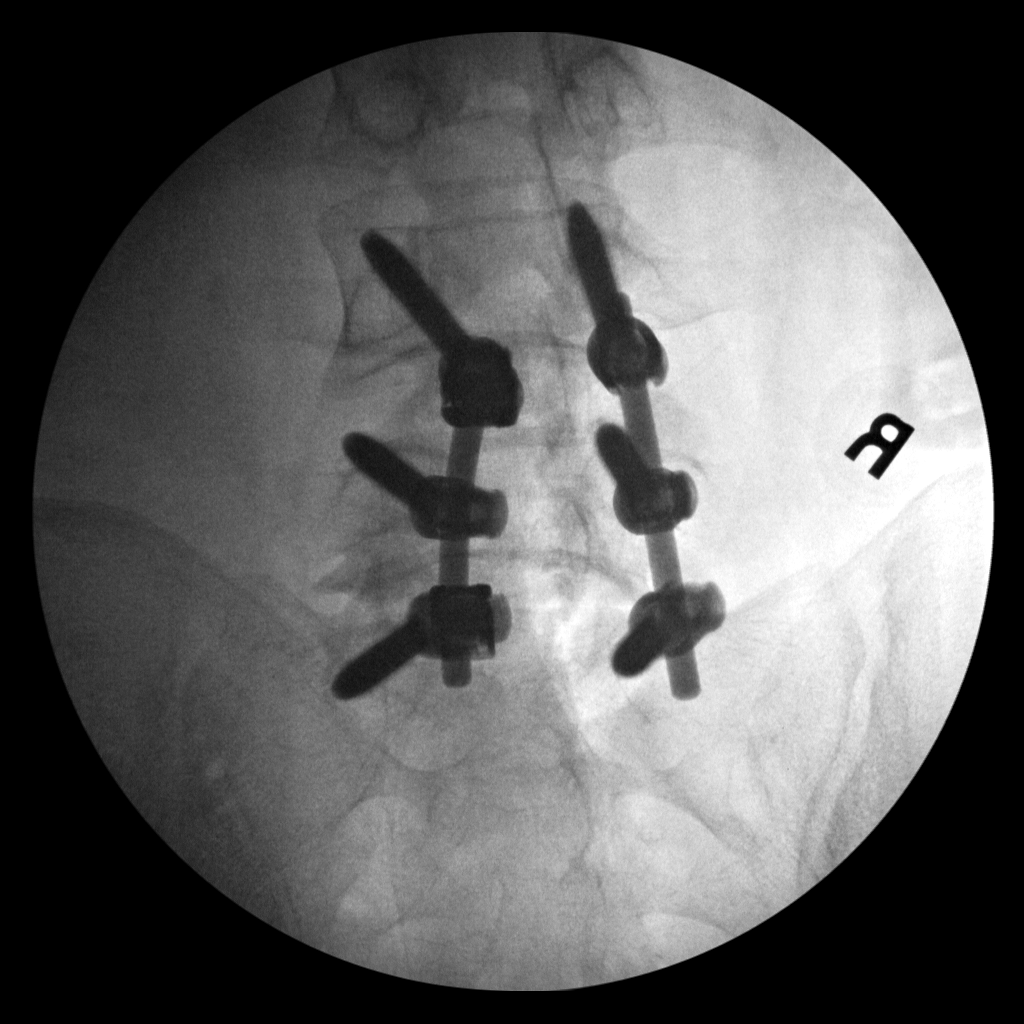
[im 4/4]
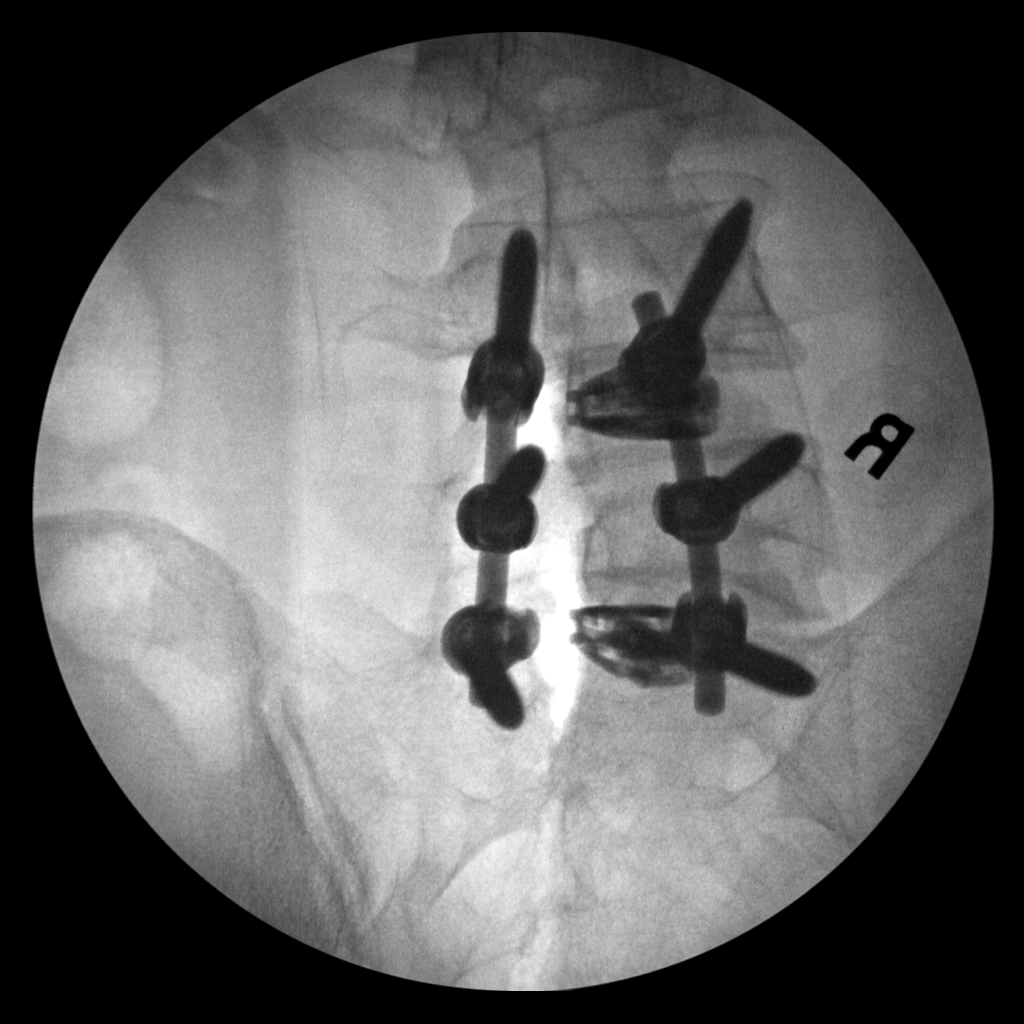

[4 of 4 positions shown; findings below may reference images not displayed]

FINDINGS: AP, lateral and oblique intraoperative fluoroscopic images of the
lumbosacral spine are submitted, 4 images total. A posterior spinal
fusion construct spans the L4-S1 levels (bilateral pedicle screws
and vertical interconnecting rods). Interbody devices are also
present at L4-L5 and L5-S1. No unexpected finding.
IMPRESSION: Two intraoperative fluoroscopic images of the lumbosacral spine from
L4-S1 fusion, as described.

## 2021-04-28 ENCOUNTER — Ambulatory Visit: Payer: 59 | Admitting: Specialist

## 2021-06-01 ENCOUNTER — Other Ambulatory Visit: Payer: Self-pay

## 2021-06-01 ENCOUNTER — Ambulatory Visit (INDEPENDENT_AMBULATORY_CARE_PROVIDER_SITE_OTHER): Payer: 59 | Admitting: Specialist

## 2021-06-01 ENCOUNTER — Ambulatory Visit: Payer: Self-pay

## 2021-06-01 VITALS — BP 118/78 | HR 68 | Ht 71.0 in | Wt 217.0 lb

## 2021-06-01 DIAGNOSIS — Z981 Arthrodesis status: Secondary | ICD-10-CM

## 2021-11-21 ENCOUNTER — Telehealth: Payer: 59 | Admitting: Physician Assistant

## 2021-11-21 DIAGNOSIS — H0100B Unspecified blepharitis left eye, upper and lower eyelids: Secondary | ICD-10-CM | POA: Diagnosis not present

## 2021-11-21 MED ORDER — NEOMYCIN-POLYMYXIN-HC 3.5-10000-1 OP SUSP
3.0000 [drp] | Freq: Four times a day (QID) | OPHTHALMIC | 0 refills | Status: AC
Start: 2021-11-21 — End: ?

## 2021-11-21 NOTE — Progress Notes (Signed)
?Virtual Visit Consent  ? ?Jamie Baldwin, you are scheduled for a virtual visit with a [facility] provider today.   ?  ?Just as with appointments in the office, your consent must be obtained to participate.  Your consent will be active for this visit and any virtual visit you may have with one of our providers in the next 365 days.   ?  ?If you have a MyChart account, a copy of this consent can be sent to you electronically.  All virtual visits are billed to your insurance company just like a traditional visit in the office.   ? ?As this is a virtual visit, video technology does not allow for your provider to perform a traditional examination.  This may limit your provider's ability to fully assess your condition.  If your provider identifies any concerns that need to be evaluated in person or the need to arrange testing (such as labs, EKG, etc.), we will make arrangements to do so.   ?  ?Although advances in technology are sophisticated, we cannot ensure that it will always work on either your end or our end.  If the connection with a video visit is poor, the visit may have to be switched to a telephone visit.  With either a video or telephone visit, we are not always able to ensure that we have a secure connection.    ? ?I need to obtain your verbal consent now.   Are you willing to proceed with your visit today?  ?  ?Jamie Baldwin has provided verbal consent on 11/21/2021 for a virtual visit (video or telephone). ?  ?Piedad Climes, PA-C  ? ?Date: 11/21/2021 11:27 AM ? ? ?Virtual Visit via Video Note  ? ?I, Piedad Climes, connected with  Jamie Baldwin  ([id number], [birth year]) on 11/21/21 at 11:15 AM EDT by a video-enabled telemedicine application and verified that I am speaking with the correct person using two identifiers. ? ?Location: ?Patient: Virtual Visit Location Patient: Home ?Provider: Virtual Visit Location Provider: Home Office ?  ?I discussed the  limitations of evaluation and management by telemedicine and the availability of in person appointments. The patient expressed understanding and agreed to proceed.   ? ?History of Present Illness: ?Jamie Baldwin is a 36 y.o. who identifies as a male who was assigned male at birth, and is being seen today for symptoms of his L eye over the past couple of weeks. Notes left lower eye lid with redness, swelling and some pain. Notes it will be present for several days and then improve, but recurs. Denies any vision changes or eye redness. Cannot feel a palpable knot or stye. Denies any excess tearing or drying of the eyes. Notes today upper eyelid is swollen and tender.  ? ?HPI: HPI  ?Problems:  ?Patient Active Problem List  ? Diagnosis Date Noted  ? Degenerative disc disease, lumbar 11/08/2020  ?  Class: Chronic  ? Status post lumbar spinal fusion 11/01/2020  ? Preventative health care 08/19/2019  ? Allergic rhinitis due to pollen   ?  ?Allergies:  ?Allergies  ?Allergen Reactions  ? Morphine   ? ?Medications:  ?Current Outpatient Medications:  ?  neomycin-polymyxin-hydrocortisone (CORTISPORIN) 3.5-10000-1 ophthalmic suspension, Place 3 drops into the left eye 4 (four) times daily., Disp: 7.5 mL, Rfl: 0 ? ?Observations/Objective: ?Patient is well-developed, well-nourished in no acute distress.  ?Resting comfortably at home.  ?Head is normocephalic, atraumatic.  ?No labored breathing. ?Speech  is clear and coherent with logical content.  ?Patient is alert and oriented at baseline.  ?L upper and lower eyelids with redness and mild swelling. Tenderness to palpation per patient. No visible stye. EOMI. L conjunctiva without injection. R eye within normal limits. ? ?Assessment and Plan: ?1. Blepharitis of both upper and lower eyelid of left eye, unspecified type ?- neomycin-polymyxin-hydrocortisone (CORTISPORIN) 3.5-10000-1 ophthalmic suspension; Place 3 drops into the left eye 4 (four) times daily.  Dispense: 7.5  mL; Refill: 0 ? ?Start warm compresses. Avoid touching the eye. Hand hygiene reviewed. Cortisporin ophthalmic per orders. Strict in-person follow-up discussed.  ? ?Follow Up Instructions: ?I discussed the assessment and treatment plan with the patient. The patient was provided an opportunity to ask questions and all were answered. The patient agreed with the plan and demonstrated an understanding of the instructions.  A copy of instructions were sent to the patient via MyChart unless otherwise noted below.  ? ?The patient was advised to call back or seek an in-person evaluation if the symptoms worsen or if the condition fails to improve as anticipated. ? ?Time:  ?I spent 10 minutes with the patient via telehealth technology discussing the above problems/concerns.   ? ?Piedad Climes, PA-C ?

## 2021-11-21 NOTE — Patient Instructions (Signed)
?  Jazz Maximino Greenland, thank you for joining Leeanne Rio, PA-C for today's virtual visit.  While this provider is not your primary care provider (PCP), if your PCP is located in our provider database this encounter information will be shared with them immediately following your visit. ? ?Consent: ?(Patient) Jamie Baldwin provided verbal consent for this virtual visit at the beginning of the encounter. ? ?Current Medications: ? ?Current Outpatient Medications:  ?  neomycin-polymyxin-hydrocortisone (CORTISPORIN) 3.5-10000-1 ophthalmic suspension, Place 3 drops into the left eye 4 (four) times daily., Disp: 7.5 mL, Rfl: 0  ? ?Medications ordered in this encounter:  ?Meds ordered this encounter  ?Medications  ? neomycin-polymyxin-hydrocortisone (CORTISPORIN) 3.5-10000-1 ophthalmic suspension  ?  Sig: Place 3 drops into the left eye 4 (four) times daily.  ?  Dispense:  7.5 mL  ?  Refill:  0  ?  Order Specific Question:   Supervising Provider  ?  Answer:   Noemi Chapel [[id number]]  ?  ? ?*If you need refills on other medications prior to your next appointment, please contact your pharmacy* ? ?Follow-Up: ?Call back or seek an in-person evaluation if the symptoms worsen or if the condition fails to improve as anticipated. ? ?Other Instructions ?Please keep hands clean and dry. ?Avoid touching the eyes. ?Apply warm compresses for 10 minutes, 2-3 times per day. ?I would wash your pillow cases before reuse.  ?Take the antibiotic drop as directed. ?If not resolving or you note new or worsening symptoms despite treatment, please be evaluated in person ASAP. DO NOT DELAY CARE.  ? ? ?If you have been instructed to have an in-person evaluation today at a local Urgent Care facility, please use the link below. It will take you to a list of all of our available [facility], including address, phone number and hours of operation. Please do not delay care.  ?[facility] ? ?If you or a  family member do not have a primary care provider, use the link below to schedule a visit and establish care. When you choose a [facility] primary care physician or advanced practice provider, you gain a long-term partner in health. ?Find a Primary Care Provider ? ?Learn more about [facility]'s in-office and virtual care options: ?[facility]  ?

## 2021-12-20 ENCOUNTER — Telehealth: Payer: 59 | Admitting: Physician Assistant

## 2021-12-20 DIAGNOSIS — U071 COVID-19: Secondary | ICD-10-CM

## 2021-12-20 MED ORDER — FLUTICASONE PROPIONATE 50 MCG/ACT NA SUSP: 2.0000 | Freq: Every day | NASAL | 0 refills | Status: AC

## 2021-12-20 MED ORDER — MOLNUPIRAVIR EUA 200MG CAPSULE: 4.0000 | ORAL_CAPSULE | Freq: Two times a day (BID) | ORAL | 0 refills | Status: AC

## 2021-12-20 MED ORDER — BENZONATATE 100 MG PO CAPS: 100.0000 mg | ORAL_CAPSULE | Freq: Three times a day (TID) | ORAL | 0 refills | Status: AC | PRN

## 2021-12-20 MED ORDER — PROMETHAZINE-DM 6.25-15 MG/5ML PO SYRP: 5.0000 mL | ORAL_SOLUTION | Freq: Four times a day (QID) | ORAL | 0 refills | Status: AC | PRN

## 2021-12-20 NOTE — Patient Instructions (Signed)
?Jamie Baldwin, thank you for joining Mar Daring, PA-C for today's virtual visit.  While this provider is not your primary care provider (PCP), if your PCP is located in our provider database this encounter information will be shared with them immediately following your visit. ? ?Consent: ?(Patient) Jamie Baldwin provided verbal consent for this virtual visit at the beginning of the encounter. ? ?Current Medications: ? ?Current Outpatient Medications:  ?  benzonatate (TESSALON) 100 MG capsule, Take 1 capsule (100 mg total) by mouth 3 (three) times daily as needed., Disp: 30 capsule, Rfl: 0 ?  fluticasone (FLONASE) 50 MCG/ACT nasal spray, Place 2 sprays into both nostrils daily., Disp: 16 g, Rfl: 0 ?  molnupiravir EUA (LAGEVRIO) 200 mg CAPS capsule, Take 4 capsules (800 mg total) by mouth 2 (two) times daily for 5 days., Disp: 40 capsule, Rfl: 0 ?  promethazine-dextromethorphan (PROMETHAZINE-DM) 6.25-15 MG/5ML syrup, Take 5 mLs by mouth 4 (four) times daily as needed., Disp: 118 mL, Rfl: 0 ?  neomycin-polymyxin-hydrocortisone (CORTISPORIN) 3.5-10000-1 ophthalmic suspension, Place 3 drops into the left eye 4 (four) times daily., Disp: 7.5 mL, Rfl: 0  ? ?Medications ordered in this encounter:  ?Meds ordered this encounter  ?Medications  ? molnupiravir EUA (LAGEVRIO) 200 mg CAPS capsule  ?  Sig: Take 4 capsules (800 mg total) by mouth 2 (two) times daily for 5 days.  ?  Dispense:  40 capsule  ?  Refill:  0  ?  Order Specific Question:   Supervising Provider  ?  Answer:   Noemi Chapel [[id number]]  ? fluticasone (FLONASE) 50 MCG/ACT nasal spray  ?  Sig: Place 2 sprays into both nostrils daily.  ?  Dispense:  16 g  ?  Refill:  0  ?  Order Specific Question:   Supervising Provider  ?  Answer:   Noemi Chapel [[id number]]  ? benzonatate (TESSALON) 100 MG capsule  ?  Sig: Take 1 capsule (100 mg total) by mouth 3 (three) times daily as needed.  ?  Dispense:  30 capsule  ?  Refill:  0  ?  Order  Specific Question:   Supervising Provider  ?  Answer:   Noemi Chapel [[id number]]  ? promethazine-dextromethorphan (PROMETHAZINE-DM) 6.25-15 MG/5ML syrup  ?  Sig: Take 5 mLs by mouth 4 (four) times daily as needed.  ?  Dispense:  118 mL  ?  Refill:  0  ?  Order Specific Question:   Supervising Provider  ?  Answer:   Noemi Chapel [[id number]]  ?  ? ?*If you need refills on other medications prior to your next appointment, please contact your pharmacy* ? ?Follow-Up: ?Call back or seek an in-person evaluation if the symptoms worsen or if the condition fails to improve as anticipated. ? ?Other Instructions ?Molnupiravir Oral Capsules ?What is this medication? ?MOLNUPIRAVIR (mol nue pir a vir) treats COVID-19. It is an antiviral medication. It may decrease the risk of developing severe symptoms of COVID-19. It may also decrease the chance of going to the hospital. This medication is not approved by the FDA. The FDA has authorized emergency use of this medication during the COVID-19 pandemic. ?This medicine may be used for other purposes; ask your health care provider or pharmacist if you have questions. ?COMMON BRAND NAME(S): LAGEVRIO ?What should I tell my care team before I take this medication? ?They need to know if you have any of these conditions: ?Any allergies ?Any serious illness ?An unusual or allergic reaction to molnupiravir,  other medications, foods, dyes, or preservatives ?Pregnant or trying to get pregnant ?Breast-feeding ?How should I use this medication? ?Take this medication by mouth with water. Take it as directed on the prescription label at the same time every day. Do not cut, crush or chew this medication. Swallow the capsules whole. You can take it with or without food. If it upsets your stomach, take it with food. Take all of this medication unless your care team tells you to stop it early. Keep taking it even if you think you are better. ?Talk to your care team about the use of this medication in children.  Special care may be needed. ?Overdosage: If you think you have taken too much of this medicine contact a poison control center or emergency room at once. ?NOTE: This medicine is only for you. Do not share this medicine with others. ?What if I miss a dose? ?If you miss a dose, take it as soon as you can unless it is more than 10 hours late. If it is more than 10 hours late, skip the missed dose. Take the next dose at the normal time. Do not take extra or 2 doses at the same time to make up for the missed dose. ?What may interact with this medication? ?Interactions have not been studied. ?This list may not describe all possible interactions. Give your health care provider a list of all the medicines, herbs, non-prescription drugs, or dietary supplements you use. Also tell them if you smoke, drink alcohol, or use illegal drugs. Some items may interact with your medicine. ?What should I watch for while using this medication? ?Your condition will be monitored carefully while you are receiving this medication. Visit your care team for regular checkups. Tell your care team if your symptoms do not start to get better or if they get worse. ?Do not become pregnant while taking this medication. You may need a pregnancy test before starting this medication. Women must use a reliable form of birth control while taking this medication and for 4 days after stopping the medication. Women should inform their care team if they wish to become pregnant or think they might be pregnant. Men should not father a child while taking this medication and for 3 months after stopping it. There is potential for serious harm to an unborn child. Talk to your care team for more information. Do not breast-feed an infant while taking this medication and for 4 days after stopping the medication. ?What side effects may I notice from receiving this medication? ?Side effects that you should report to your care team as soon as possible: ?Allergic  reactions--skin rash, itching, hives, swelling of the face, lips, tongue, or throat ?Side effects that usually do not require medical attention (report these to your care team if they continue or are bothersome): ?Diarrhea ?Dizziness ?Nausea ?This list may not describe all possible side effects. Call your doctor for medical advice about side effects. You may report side effects to FDA at 1-800-FDA-1088. ?Where should I keep my medication? ?Keep out of the reach of children and pets. ?Store at room temperature between 20 and 25 degrees C (68 and 77 degrees F). Get rid of any unused medication after the expiration date. ?To get rid of medications that are no longer needed or have expired: ?Take the medication to a medication take-back program. Check with your pharmacy or law enforcement to find a location. ?If you cannot return the medication, check the label or package insert to see  if the medication should be thrown out in the garbage or flushed down the toilet. If you are not sure, ask your care team. If it is safe to put it in the trash, take the medication out of the container. Mix the medication with cat litter, dirt, coffee grounds, or other unwanted substance. Seal the mixture in a bag or container. Put it in the trash. ?NOTE: This sheet is a summary. It may not cover all possible information. If you have questions about this medicine, talk to your doctor, pharmacist, or health care provider. ?? 2022 Elsevier/Gold Standard (2020-09-05 00:00:00) ? ? ?10 Things You Can Do to Manage Your COVID-19 Symptoms at Home ?If you have possible or confirmed COVID-19 ?Stay home except to get medical care. ?Monitor your symptoms carefully. If your symptoms get worse, call your healthcare provider immediately. ?Get rest and stay hydrated. ?If you have a medical appointment, call the healthcare provider ahead of time and tell them that you have or may have COVID-19. ?For medical emergencies, call 911 and notify the dispatch  personnel that you have or may have COVID-19. ?Cover your cough and sneezes with a tissue or use the inside of your elbow. ?Wash your hands often with soap and water for at least 20 seconds or clean your hands with a

## 2021-12-20 NOTE — Progress Notes (Signed)
?Virtual Visit Consent  ? ?Jamie Baldwin, you are scheduled for a virtual visit with a [facility] provider today.   ?  ?Just as with appointments in the office, your consent must be obtained to participate.  Your consent will be active for this visit and any virtual visit you may have with one of our providers in the next 365 days.   ?  ?If you have a MyChart account, a copy of this consent can be sent to you electronically.  All virtual visits are billed to your insurance company just like a traditional visit in the office.   ? ?As this is a virtual visit, video technology does not allow for your provider to perform a traditional examination.  This may limit your provider's ability to fully assess your condition.  If your provider identifies any concerns that need to be evaluated in person or the need to arrange testing (such as labs, EKG, etc.), we will make arrangements to do so.   ?  ?Although advances in technology are sophisticated, we cannot ensure that it will always work on either your end or our end.  If the connection with a video visit is poor, the visit may have to be switched to a telephone visit.  With either a video or telephone visit, we are not always able to ensure that we have a secure connection.    ? ?I need to obtain your verbal consent now.   Are you willing to proceed with your visit today?  ?  ?Bryten Maher has provided verbal consent on 12/20/2021 for a virtual visit (video or telephone). ?  ?Margaretann Loveless, PA-C  ? ?Date: 12/20/2021 9:00 AM ? ? ?Virtual Visit via Video Note  ? ?IMargaretann Loveless, connected with  Jamie Baldwin  ([id number], [birth year]) on 12/20/21 at  8:45 AM EDT by a video-enabled telemedicine application and verified that I am speaking with the correct person using two identifiers. ? ?Location: ?Patient: Virtual Visit Location Patient: Home ?Provider: Virtual Visit Location Provider: Home Office ?  ?I discussed the  limitations of evaluation and management by telemedicine and the availability of in person appointments. The patient expressed understanding and agreed to proceed.   ? ?History of Present Illness: ?Jamie Baldwin is a 36 y.o. who identifies as a male who was assigned male at birth, and is being seen today for Covid 74. ? ?HPI: URI  ?This is a new problem. The current episode started yesterday (Tested positive for covid 19 yesterday; symptoms started yesterday). The problem has been gradually worsening. The maximum temperature recorded prior to his arrival was 102 - 102.9 F. Associated symptoms include congestion, coughing, headaches, a plugged ear sensation, rhinorrhea, sinus pain and a sore throat. Pertinent negatives include no diarrhea, ear pain, nausea or vomiting. Associated symptoms comments: Hoarse voice, chills, sweats. He has tried steam, increased fluids, antihistamine, acetaminophen and NSAIDs for the symptoms. The treatment provided no relief.   ? ? ?Problems:  ?Patient Active Problem List  ? Diagnosis Date Noted  ? Degenerative disc disease, lumbar 11/08/2020  ?  Class: Chronic  ? Status post lumbar spinal fusion 11/01/2020  ? Preventative health care 08/19/2019  ? Allergic rhinitis due to pollen   ?  ?Allergies:  ?Allergies  ?Allergen Reactions  ? Morphine   ? ?Medications:  ?Current Outpatient Medications:  ?  benzonatate (TESSALON) 100 MG capsule, Take 1 capsule (100 mg total) by mouth 3 (three) times  daily as needed., Disp: 30 capsule, Rfl: 0 ?  fluticasone (FLONASE) 50 MCG/ACT nasal spray, Place 2 sprays into both nostrils daily., Disp: 16 g, Rfl: 0 ?  molnupiravir EUA (LAGEVRIO) 200 mg CAPS capsule, Take 4 capsules (800 mg total) by mouth 2 (two) times daily for 5 days., Disp: 40 capsule, Rfl: 0 ?  promethazine-dextromethorphan (PROMETHAZINE-DM) 6.25-15 MG/5ML syrup, Take 5 mLs by mouth 4 (four) times daily as needed., Disp: 118 mL, Rfl: 0 ?  neomycin-polymyxin-hydrocortisone  (CORTISPORIN) 3.5-10000-1 ophthalmic suspension, Place 3 drops into the left eye 4 (four) times daily., Disp: 7.5 mL, Rfl: 0 ? ?Observations/Objective: ?Patient is well-developed, well-nourished in no acute distress.  ?Resting comfortably at home.  ?Head is normocephalic, atraumatic.  ?No labored breathing.  ?Speech is clear and coherent with logical content.  ?Patient is alert and oriented at baseline.  ? ? ?Assessment and Plan: ?1. COVID-19 ?- molnupiravir EUA (LAGEVRIO) 200 mg CAPS capsule; Take 4 capsules (800 mg total) by mouth 2 (two) times daily for 5 days.  Dispense: 40 capsule; Refill: 0 ?- fluticasone (FLONASE) 50 MCG/ACT nasal spray; Place 2 sprays into both nostrils daily.  Dispense: 16 g; Refill: 0 ?- benzonatate (TESSALON) 100 MG capsule; Take 1 capsule (100 mg total) by mouth 3 (three) times daily as needed.  Dispense: 30 capsule; Refill: 0 ?- promethazine-dextromethorphan (PROMETHAZINE-DM) 6.25-15 MG/5ML syrup; Take 5 mLs by mouth 4 (four) times daily as needed.  Dispense: 118 mL; Refill: 0 ? ?- Continue OTC symptomatic management of choice ?- Will send OTC vitamins and supplement information through AVS ?- Molnupiravir, flonase, tessalon, and Promethazine DM prescribed ?- Patient enrolled in MyChart symptom monitoring ?- Push fluids ?- Rest as needed ?- Discussed return precautions and when to seek in-person evaluation, sent via AVS as well ? ?Follow Up Instructions: ?I discussed the assessment and treatment plan with the patient. The patient was provided an opportunity to ask questions and all were answered. The patient agreed with the plan and demonstrated an understanding of the instructions.  A copy of instructions were sent to the patient via MyChart unless otherwise noted below.  ? ? ?The patient was advised to call back or seek an in-person evaluation if the symptoms worsen or if the condition fails to improve as anticipated. ? ?Time:  ?I spent 15 minutes with the patient via telehealth  technology discussing the above problems/concerns.   ? ?Margaretann Loveless, PA-C ?

## 2022-01-25 ENCOUNTER — Encounter (HOSPITAL_BASED_OUTPATIENT_CLINIC_OR_DEPARTMENT_OTHER): Payer: Self-pay

## 2022-01-25 ENCOUNTER — Other Ambulatory Visit: Payer: Self-pay

## 2022-01-25 ENCOUNTER — Emergency Department (HOSPITAL_BASED_OUTPATIENT_CLINIC_OR_DEPARTMENT_OTHER)
Admission: EM | Admit: 2022-01-25 | Discharge: 2022-01-25 | Disposition: A | Discharge status: Home / Self Care | Payer: 59 | Attending: Emergency Medicine | Admitting: Emergency Medicine

## 2022-01-25 DIAGNOSIS — H0289 Other specified disorders of eyelid: Secondary | ICD-10-CM | POA: Diagnosis present

## 2022-01-25 NOTE — ED Triage Notes (Signed)
Pt reports spontaneous bleeding left eye, happened Tuesday, saw opthamologist, told possibly from inflammation from allergies.  Denies hx HTN, no medication.  Occasional ibuprofen, not taken in weeks.

## 2022-01-25 NOTE — ED Provider Notes (Signed)
[facility] EMERGENCY DEPARTMENT Provider Note   CSN: [id number] Arrival date & time: 01/25/22  1411     History  No chief complaint on file.   Jamie Baldwin is a 36 y.o. male.  Patient is a 36 year old male with no significant past medical history presenting for bleeding from his left eye.  Denies any eye trauma or blunt head trauma.  States bleeding began Saturday of this week.  States he followed on Tuesday with ophthalmologist but the bleeding had stopped at the time and he was unable to find anything.  States the bleeding restarted today.  Denies any vision changes.  History of hypertension.  Not take blood thinners, Motrin, or daily alcohol use.  The history is provided by the patient. No language interpreter was used.      Home Medications Prior to Admission medications   Medication Sig Start Date End Date Taking? Authorizing Provider  benzonatate (TESSALON) 100 MG capsule Take 1 capsule (100 mg total) by mouth 3 (three) times daily as needed. 12/20/21   Margaretann Loveless, PA-C  fluticasone (FLONASE) 50 MCG/ACT nasal spray Place 2 sprays into both nostrils daily. 12/20/21   Margaretann Loveless, PA-C  neomycin-polymyxin-hydrocortisone (CORTISPORIN) 3.5-10000-1 ophthalmic suspension Place 3 drops into the left eye 4 (four) times daily. 11/21/21   Waldon Merl, PA-C  promethazine-dextromethorphan (PROMETHAZINE-DM) 6.25-15 MG/5ML syrup Take 5 mLs by mouth 4 (four) times daily as needed. 12/20/21   Margaretann Loveless, PA-C      Allergies    Patient has no known allergies.    Review of Systems   Review of Systems  Constitutional:  Negative for chills and fever.  Eyes:  Negative for photophobia, pain, discharge, redness, itching and visual disturbance.   Physical Exam Updated Vital Signs BP (!) 149/98 (BP Location: Right Arm)   Pulse 82   Temp 98.6 F (37 C) (Oral)   Resp 20   Ht 5\' 10"  (1.778 m)   Wt 81.6 kg   SpO2 100%   BMI 25.83 kg/m   Physical Exam Vitals and nursing note reviewed.  Constitutional:      Appearance: Normal appearance.  HENT:     Head: Normocephalic and atraumatic.  Eyes:     General: Lids are normal. Vision grossly intact.     Pupils: Pupils are equal, round, and reactive to light.   Cardiovascular:     Rate and Rhythm: Normal rate.  Pulmonary:     Effort: Pulmonary effort is normal.     Breath sounds: Normal breath sounds.  Neurological:     Mental Status: He is alert.    ED Results / Procedures / Treatments   Labs (all labs ordered are listed, but only abnormal results are displayed) Labs Reviewed - No data to display  EKG None  Radiology No results found.  Procedures Procedures    Medications Ordered in ED Medications - No data to display  ED Course/ Medical Decision Making/ A&P                           Medical Decision Making  75:92 PM a 36 year old male with no significant past medical history presenting for bleeding from his left eye.  Is alert and attention, no acute distress, afebrile, stable vital signs.  Physical exam demonstrates blood clot under left medial eyelid.  No active bleeding at this time.  Extraocular eye movements intact.  Gross vision intact.  PERRLA.  Spoke with ophthalmologist on-call who recommends she be seen in his clinic directly after discharge.  Patient agreeable to plan.  Patient in no distress and overall condition improved here in the ED. Detailed discussions were had with the patient regarding current findings, and need for close f/u with PCP or on call doctor. The patient has been instructed to return immediately if the symptoms worsen in any way for re-evaluation. Patient verbalized understanding and is in agreement with current care plan. All questions answered prior to discharge.         Final Clinical Impression(s) / ED Diagnoses Final diagnoses:  Eyelid bleeding    Rx / DC Orders ED Discharge Orders     None          Franne Forts, DO 01/27/22 1354

## 2022-07-10 ENCOUNTER — Encounter: Payer: Self-pay | Admitting: Specialist

## 2022-07-10 NOTE — Progress Notes (Signed)
Office Visit Note   Patient: Jamie Baldwin           Date of Birth: 07/05/1986           MRN: [id number] Visit Date: 06/01/2021              Requested by: Merlene Laughter, MD [phone],  [place] [address] PCP: Pcp, No   Assessment & Plan: Visit Diagnoses:  1. S/P lumbar fusion     Plan: Avoid frequent bending and stooping  No lifting greater than 10 lbs. May use ice or moist heat for pain. Weight loss is of benefit. Best medication for lumbar disc disease is arthritis medications like motrin or tylenol.  Exercise is important to improve your indurance and does allow people to function better inspite of back pain. Recommend follow up in 6-12 months.     Follow-Up Instructions: No follow-ups on file.   Orders:  Orders Placed This Encounter  Procedures   XR Lumbar Spine 2-3 Views   No orders of the defined types were placed in this encounter.     Procedures: No procedures performed   Clinical Data: No additional findings.   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 He is doing well, no complaints today. Standing and walking is painfree. No use of  Medications for pain. No bowel or bladder difficulty. He is happy with the results of surgery.     Review of Systems  Constitutional: Negative.   HENT: Negative.    Eyes: Negative.   Respiratory: Negative.    Cardiovascular: Negative.   Gastrointestinal: Negative.   Endocrine: Negative.   Genitourinary: Negative.   Musculoskeletal: Negative.   Skin: Negative.   Allergic/Immunologic: Negative.   Neurological: Negative.   Hematological: Negative.   Psychiatric/Behavioral: Negative.       Objective: Vital Signs: BP 118/78 (BP Location: Left Arm, Patient Position: Sitting, Cuff Size: Small)   Pulse 68    Ht 5\' 11"  (1.803 m)   Wt 217 lb (98.4 kg)   BMI 30.27 kg/m   Physical Exam Constitutional:      Appearance: He is well-developed.  HENT:     Head: Normocephalic and atraumatic.  Eyes:     Pupils: Pupils are equal, round, and reactive to light.  Pulmonary:     Effort: Pulmonary effort is normal.     Breath sounds: Normal breath sounds.  Abdominal:     General: Bowel sounds are normal.     Palpations: Abdomen is soft.  Musculoskeletal:     Cervical back: Normal range of motion and neck supple.     Lumbar back: Negative right straight leg raise test and negative left straight leg raise test.  Skin:    General: Skin is warm and dry.  Neurological:     Mental Status: He is alert and oriented to person, place, and time.  Psychiatric:        Behavior: Behavior normal.        Thought Content: Thought content normal.        Judgment: Judgment normal.     Back Exam   Tenderness  The patient is experiencing tenderness in the lumbar.  Range of Motion  Extension:  normal  Flexion:  abnormal  Lateral bend right:  abnormal  Lateral  bend left:  abnormal  Rotation right:  abnormal  Rotation left:  abnormal   Muscle Strength  Right Quadriceps:  5/5  Left Quadriceps:  5/5  Right Hamstrings:  5/5  Left Hamstrings:  5/5   Tests  Straight leg raise right: negative Straight leg raise left: negative  Reflexes  Patellar:  2/4 Achilles:  2/4  Other  Toe walk: normal Heel walk: normal Sensation: normal Gait: normal   Comments:  No focal motor deficit Sensory intact.       Specialty Comments:  No specialty comments available.  Imaging: No results found.   PMFS History: Patient Active Problem List   Diagnosis Date Noted   Degenerative disc disease, lumbar 11/08/2020    Priority: High    Class: Chronic   Status post lumbar spinal fusion 11/01/2020   Preventative health care 08/19/2019   Allergic rhinitis due to pollen    Past Medical History:  Diagnosis  Date   Allergic rhinitis due to pollen    Lumbar stenosis 2021    Family History  Problem Relation Age of Onset   Diabetes Neg Hx    Cancer Neg Hx    Heart disease Neg Hx     Past Surgical History:  Procedure Laterality Date   WISDOM TOOTH EXTRACTION     Social History   Occupational History   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

## 2022-07-10 NOTE — Patient Instructions (Signed)
Avoid frequent bending and stooping  No lifting greater than 10 lbs. May use ice or moist heat for pain. Weight loss is of benefit. Best medication for lumbar disc disease is arthritis medications like motrin or tylenol.  Exercise is important to improve your indurance and does allow people to function better inspite of back pain. Recommend follow up in 6-12 months.
